# Patient Record
Sex: Female | Born: 1960 | Race: White | Hispanic: No | Marital: Married | Smoking: Former smoker
Health system: Southern US, Community
[De-identification: ages and names within clinical notes are randomized; demographics above are authoritative.]

## PROBLEM LIST (undated history)

## (undated) DIAGNOSIS — F329 Major depressive disorder, single episode, unspecified: Secondary | ICD-10-CM

## (undated) DIAGNOSIS — F32A Depression, unspecified: Secondary | ICD-10-CM

## (undated) HISTORY — DX: Major depressive disorder, single episode, unspecified: F32.9

## (undated) HISTORY — DX: Depression, unspecified: F32.A

---

## 2001-12-13 ENCOUNTER — Encounter: Payer: Self-pay | Admitting: Emergency Medicine

## 2001-12-13 ENCOUNTER — Emergency Department (HOSPITAL_COMMUNITY): Admission: EM | Admit: 2001-12-13 | Discharge: 2001-12-13 | Payer: Self-pay | Admitting: Emergency Medicine

## 2010-11-01 ENCOUNTER — Ambulatory Visit (INDEPENDENT_AMBULATORY_CARE_PROVIDER_SITE_OTHER): Payer: Managed Care, Other (non HMO) | Admitting: Physician Assistant

## 2010-11-01 DIAGNOSIS — F39 Unspecified mood [affective] disorder: Secondary | ICD-10-CM

## 2010-11-17 ENCOUNTER — Encounter (HOSPITAL_COMMUNITY): Payer: Self-pay | Admitting: Physician Assistant

## 2010-11-17 ENCOUNTER — Ambulatory Visit (INDEPENDENT_AMBULATORY_CARE_PROVIDER_SITE_OTHER): Payer: Managed Care, Other (non HMO) | Admitting: Physician Assistant

## 2010-11-17 VITALS — BP 126/85 | HR 98 | Temp 97.4°F | Ht 63.0 in | Wt 130.8 lb

## 2010-11-17 DIAGNOSIS — F329 Major depressive disorder, single episode, unspecified: Secondary | ICD-10-CM

## 2010-11-17 DIAGNOSIS — G479 Sleep disorder, unspecified: Secondary | ICD-10-CM

## 2010-11-17 DIAGNOSIS — M542 Cervicalgia: Secondary | ICD-10-CM | POA: Insufficient documentation

## 2010-11-17 DIAGNOSIS — F341 Dysthymic disorder: Secondary | ICD-10-CM

## 2010-11-17 NOTE — Patient Instructions (Signed)
Pt. Educated regarding alcohol and sleep, benzos and alcohol, and poor sleep.  Pt. Is to start with [facility] regarding out patient therapy.

## 2010-11-17 NOTE — Progress Notes (Signed)
  [facility] [id number] Progress Note  GAYNA BRADDY [id number] 50 y.o.  11/17/2010 10:08 AM  Chief Complaint:   History of Present Illness: Suicidal Ideation: No Plan Formed: No Patient has means to carry out plan: No  Homicidal Ideation: No Plan Formed: No Patient has means to carry out plan: No  Review of Systems: Psychiatric: Agitation: Yes Hallucination: No Depressed Mood: Yes Insomnia: Yes Hypersomnia: No Altered Concentration: Yes Feels Worthless: Yes Grandiose Ideas: No Belief In Special Powers: No New/Increased Substance Abuse: Yes Compulsions: Yes  Neurologic: Headache: Yes Seizure: No Paresthesias: No  Past Medical Family, Social History: Brother bipolar, son bipolar  Outpatient Encounter Prescriptions as of 11/17/2010  Medication Sig Dispense Refill  . buPROPion (WELLBUTRIN XL) 300 MG 24 hr tablet Take 300 mg by mouth daily.        . diazepam (VALIUM) 5 MG tablet       . escitalopram (LEXAPRO) 10 MG tablet Take 10 mg by mouth daily.        Marland Kitchen HYDROcodone-acetaminophen (NORCO) 10-325 MG per tablet       . Multiple Vitamins-Minerals (MULTIVITAMIN WITH MINERALS) tablet Take 1 tablet by mouth daily.          Past Psychiatric History/Hospitalization(s): Anxiety: Yes Bipolar Disorder: No Depression: Yes Mania: No Psychosis: No Schizophrenia: No Personality Disorder: No Hospitalization for psychiatric illness: No History of Electroconvulsive Shock Therapy: No Prior Suicide Attempts: No  Physical Exam: Constitutional:  BP 126/85  Pulse 98  Temp(Src) 97.4 F (36.3 C) (Temporal)  Ht 5\' 3"  (1.6 m)  Wt 130 lb 12.8 oz (59.33 kg)  BMI 23.17 kg/m2  General Appearance: alert, oriented, no acute distress and well nourished  Musculoskeletal: Strength & Muscle Tone: within normal limits Gait & Station: normal Patient leans: N/A  Psychiatric: Speech (describe rate, volume, coherence, spontaneity, and abnormalities if any): Normal rate and  rhythm  Thought Process (describe rate, content, abstract reasoning, and computation): linear  Associations: Coherent and Circumstantial  Thoughts: normal  Mental Status: Orientation: oriented to person, place, time/date and situation Mood & Affect: depressed affect, anxiety and agitation Attention Span & Concentration: normal  Medical Decision Making (Choose Three): Review of Psycho-Social Stressors (1)  Assessment: Axis I: GAD            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  Onyx Schirmer, PA 11/17/2010

## 2010-11-21 ENCOUNTER — Ambulatory Visit (HOSPITAL_COMMUNITY): Payer: Managed Care, Other (non HMO) | Admitting: Psychology

## 2010-12-11 ENCOUNTER — Ambulatory Visit (HOSPITAL_COMMUNITY): Payer: Managed Care, Other (non HMO) | Admitting: Physician Assistant

## 2010-12-26 ENCOUNTER — Ambulatory Visit (INDEPENDENT_AMBULATORY_CARE_PROVIDER_SITE_OTHER): Payer: Managed Care, Other (non HMO) | Admitting: Psychology

## 2010-12-26 ENCOUNTER — Ambulatory Visit (HOSPITAL_COMMUNITY): Payer: Managed Care, Other (non HMO) | Admitting: Psychology

## 2010-12-26 DIAGNOSIS — F331 Major depressive disorder, recurrent, moderate: Secondary | ICD-10-CM
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
## 2011-01-30 ENCOUNTER — Ambulatory Visit (HOSPITAL_COMMUNITY): Payer: Managed Care, Other (non HMO) | Admitting: Psychology

## 2011-02-06 ENCOUNTER — Ambulatory Visit (HOSPITAL_COMMUNITY): Payer: Managed Care, Other (non HMO) | Admitting: Psychology

## 2011-02-22 ENCOUNTER — Ambulatory Visit (HOSPITAL_COMMUNITY): Payer: Managed Care, Other (non HMO) | Admitting: Psychology

## 2011-02-28 ENCOUNTER — Ambulatory Visit (INDEPENDENT_AMBULATORY_CARE_PROVIDER_SITE_OTHER): Payer: Managed Care, Other (non HMO) | Admitting: Licensed Clinical Social Worker

## 2011-02-28 ENCOUNTER — Encounter (HOSPITAL_COMMUNITY): Payer: Self-pay | Admitting: Licensed Clinical Social Worker

## 2011-02-28 DIAGNOSIS — F331 Major depressive disorder, recurrent, moderate: Secondary | ICD-10-CM
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
## 2011-03-06 ENCOUNTER — Ambulatory Visit (HOSPITAL_COMMUNITY): Payer: Managed Care, Other (non HMO) | Admitting: Psychology

## 2011-03-07 ENCOUNTER — Encounter (HOSPITAL_COMMUNITY): Payer: Self-pay | Admitting: Licensed Clinical Social Worker

## 2011-03-07 ENCOUNTER — Ambulatory Visit (INDEPENDENT_AMBULATORY_CARE_PROVIDER_SITE_OTHER): Payer: Managed Care, Other (non HMO) | Admitting: Licensed Clinical Social Worker

## 2011-03-07 DIAGNOSIS — F331 Major depressive disorder, recurrent, moderate: Secondary | ICD-10-CM

## 2011-03-07 NOTE — Progress Notes (Signed)
   THERAPIST PROGRESS NOTE  Session Time: 9:12 - 10:00  Participation Level: Active  Behavioral Response: Well GroomedAlertAngry and Depressed  Type of Therapy: Individual Therapy  Treatment Goals addressed: Diagnosis: irritability and anger  Interventions: Motivational Interviewing, Solution Focused and Supportive  Summary: Katherine Sandoval is a 51 y.o. female who presents with depression.  Today Katherine Sandoval came into session with some joking and laughing.  As we started talking, she became more serious and depressed.  She is feeling "miserable" with her life.  Recently she traveled for her sons [employer] - she found herself not interested and irritable. She yelled at some people who were stepping on her - she was sitting on the floor against the wall and she was being treated like she was invisible.  She noticed that they walked around other people that were sitting on the floor but kept stepping on her.  Sounded like she had a good reason to be angry but she finds herself being too nasty in her comments. She and her husband spend no real time together and he is constantly putting her down - she cannot do things well enough for him.  The  focus in her life has been her children and trying to please her husband. Now she feels empty and unfulfilled.  Discussion today was mainly about how she feels unappreciated and she does not really know what would make her feel better.  Agreed to do some work on this problem.  She  Also may be dealing with menopause.  Still did not get to discussing her drinking for she indicated that she drowns her sorrows in wine in the eve.      ..   Suicidal/Homicidal: Nowithout intent/plan  Therapist Response: She is stating clearly that she does not want to commit suicide but she is so unhappy that she does not want to be here at times.  Plan: Return again in 1 weeks.  Diagnosis: Axis I: Major Depressive Disorder, mod, rec    Axis II:  Deferred    Takashi Korol,[name], LCSW 03/07/2011 2

## 2011-03-15 ENCOUNTER — Ambulatory Visit (INDEPENDENT_AMBULATORY_CARE_PROVIDER_SITE_OTHER): Payer: Managed Care, Other (non HMO) | Admitting: Licensed Clinical Social Worker

## 2011-03-15 DIAGNOSIS — F331 Major depressive disorder, recurrent, moderate: Secondary | ICD-10-CM
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
## 2011-03-22 ENCOUNTER — Ambulatory Visit (INDEPENDENT_AMBULATORY_CARE_PROVIDER_SITE_OTHER): Payer: Managed Care, Other (non HMO) | Admitting: Licensed Clinical Social Worker

## 2011-03-22 DIAGNOSIS — F329 Major depressive disorder, single episode, unspecified: Secondary | ICD-10-CM

## 2011-03-22 NOTE — Progress Notes (Signed)
   THERAPIST PROGRESS NOTE  Session Time: 11:10 - 1210  Participation Level: Active  Behavioral Response: CasualAlertEuthymic  Type of Therapy: Individual Therapy  Treatment Goals addressed: Anger and Anxiety  Interventions: Motivational Interviewing, Strength-based and Supportive  Summary: Katherine Sandoval is a 51 y.o. female who presents with problems of depression.  Katherine Sandoval is starting to get her resume together so that she could start applying for office jobs - she decided that she needed a job with insurance attached.   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  Her husband is helping her with the computer    She can get her needs met for decorating by working on her house which she enjoys doing.She was in pretty good spirits today as she was talking about what she was working on for herself.  Commented on that fact to her and she felt pleased.  Suicidal/Homicidal: Nowithout intent/plan  Plan: Return again in 1 weeks.  Diagnosis: Axis I: Depressive Disorder NOS    Axis II: Deferred    Cali Cuartas,[name], LCSW 03/22/2011

## 2011-03-29 ENCOUNTER — Ambulatory Visit (HOSPITAL_COMMUNITY): Payer: Self-pay | Admitting: Licensed Clinical Social Worker

## 2011-04-05 ENCOUNTER — Ambulatory Visit (INDEPENDENT_AMBULATORY_CARE_PROVIDER_SITE_OTHER): Payer: Managed Care, Other (non HMO) | Admitting: Licensed Clinical Social Worker

## 2011-04-05 DIAGNOSIS — F329 Major depressive disorder, single episode, unspecified: Secondary | ICD-10-CM

## 2011-04-10 ENCOUNTER — Ambulatory Visit (HOSPITAL_COMMUNITY): Payer: Self-pay | Admitting: Licensed Clinical Social Worker

## 2011-04-11 NOTE — Progress Notes (Signed)
   THERAPIST PROGRESS NOTE  Session Time: 11:05 - 12:00  Participation Level: Active  Behavioral Response: NeatAlertAngry and Anxious  Type of Therapy: Individual Therapy  Treatment Goals addressed: Anger and Anxiety  Interventions: Motivational Interviewing, Solution Focused and Supportive  Summary: Katherine Sandoval is a 51 y.o. female who presents with problems with depression.  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  She has accepted that it will take awhile for she has been out of work force for quite awhile.   She talked about the time she lived with her aunt and uncle when she first left home.  How they would take all of her money - they always needed money for something  Mother was home taking care of my sister.  She felt very abandoned by her mother. She also discussed her oldest son who has been clean now for three weeks. Her own feeling of abandonment is probably preventing her from pushing on her older son.  He has a girl friend that he feels in love with and he has a guy friend who is not a very good influence.    Suicidal/Homicidal: Nowithout intent/plan  Plan: Return again in 1 weeks.  Diagnosis: Axis I: Major Depression, Recurrent severe    Axis II: Deferred    Jimy Gates,[name], LCSW 04/11/2011

## 2011-04-17 ENCOUNTER — Ambulatory Visit (INDEPENDENT_AMBULATORY_CARE_PROVIDER_SITE_OTHER): Payer: Managed Care, Other (non HMO) | Admitting: Licensed Clinical Social Worker

## 2011-04-17 DIAGNOSIS — F339 Major depressive disorder, recurrent, unspecified: Secondary | ICD-10-CM

## 2011-04-17 NOTE — Progress Notes (Signed)
  1 THERAPIST PROGRESS NOTE  Session Time: 10;00 11:00  Participation Level: Active  Behavioral Response: CasualAlertDepressed  Type of Therapy: Individual Therapy  Treatment Goals addressed: Anxiety  Interventions: Motivational Interviewing, Strength-based and Supportive  Summary: Katherine Sandoval is a 51 y.o. female who presents with depression.  Today Katherine Sandoval had low energy - she sounded discouraged about about her efforts to choose things that enhance her own self image.  She reports that her husband seems to ruin everything that she tries to do.  She gave up her project in the bathroom because he is refuses to go take his shower in another bathroom   He also gets involved in her projects and re does things.  She feels powerless to do anything about it so she is giving up. Confronted her about the fact that she spent a lot of time complaining about her husband - she needed to use her energy is doing things that gave her a deep sense of satisfaction.  She decided that she needed to develop her back bone again..   Suicidal/Homicidal: Nowithout intent/plan  Therapist Response: She seems to respond to confrontation about actions she could take and not let others take her power away.  Plan: Return again in 1 weeks.  Diagnosis: Axis I: Major depression recurrent    Axis II: Deferred    Keary Hanak,[name], LCSW 04/17/2011

## 2011-04-25 ENCOUNTER — Ambulatory Visit (INDEPENDENT_AMBULATORY_CARE_PROVIDER_SITE_OTHER): Payer: Managed Care, Other (non HMO) | Admitting: Licensed Clinical Social Worker

## 2011-04-25 DIAGNOSIS — F331 Major depressive disorder, recurrent, moderate: Secondary | ICD-10-CM

## 2011-04-25 NOTE — Progress Notes (Signed)
  THERAPIST PROGRESS NOTE  Session Time: 10:00 - 11:00  Participation Level: Active  Behavioral Response: Well GroomedAlertDepressed  Type of Therapy: Individual Therapy  Treatment Goals addressed: Coping  Interventions: Motivational Interviewing, Strength-based and Supportive  Summary: BONNITA NEWBY is a 51 y.o. female who presents with depression. Today she appeared depressed - she stated that she has been depressed since last Thurs and she mainly has been staying in bed.  She could not identify a trigger at first but later in the session she remembered that she was talking to her mother and she had shared with her mother that she was thinking about going into Real [place]. Her mother's reaction was the same as usual - very discouraging and negative.  The main problem Paloma has is how she has lost herself in her role as wife and mother.  She is bored and unhappy with her life.  She spends it mainly catering to her husband and her three boys. Her oldest who is 22 has moved back home.  He seems to be satisfied to live there without much consequence - he also spends what money he makes on pot.  Her husband would not support setting limits with him and making a plan to move out.  If the boys forgot something she will bring it to them.  She is enabling her sons to be irresponsible.  She would like to be different - her husband is even worse in the enabling game.  She has not been given support by her mother in relation to being her own person.  The boys are almost all grown and she should have an empty nest so she will have less to be involved with  She admits that she is probably having trouble letting go. She will start looking at other options for herself.    Suicidal/Homicidal: Nowithout intent/plan  Plan: Return again in 1 weeks.  Diagnosis: Axis I: Major depressive disorder recurrent    Axis II: Deferred    Richelle Glick,[name], LCSW 04/25/2011

## 2011-05-02 ENCOUNTER — Ambulatory Visit (INDEPENDENT_AMBULATORY_CARE_PROVIDER_SITE_OTHER): Payer: Managed Care, Other (non HMO) | Admitting: Licensed Clinical Social Worker

## 2011-05-02 DIAGNOSIS — F339 Major depressive disorder, recurrent, unspecified: Secondary | ICD-10-CM
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
## 2011-05-16 ENCOUNTER — Ambulatory Visit (INDEPENDENT_AMBULATORY_CARE_PROVIDER_SITE_OTHER): Payer: Managed Care, Other (non HMO) | Admitting: Licensed Clinical Social Worker

## 2011-05-16 DIAGNOSIS — F331 Major depressive disorder, recurrent, moderate: Secondary | ICD-10-CM

## 2011-05-16 NOTE — Progress Notes (Signed)
   THERAPIST PROGRESS NOTE  Session Time: 10:10 - 11:05  Participation Level: Active  Behavioral Response: NeatAlertEuthymic  Type of Therapy: Individual Therapy  Treatment Goals addressed: Diagnosis: decreasing depression  Interventions: Motivational Interviewing and Supportive  Summary: Katherine Sandoval is a 51 y.o. female who presents with depression from loss of self.  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  There is one she is particularly interested in which is a new office.  She is interested in [employer] but she feels she needs health insurance if something were to happen with her marriage.  She has done this work before and likes it.  She is excited to have something for herself to look forward to.  Everyone in family are supportive of her.  She has informed all that her availability will be less.  She brought up the abortion she had when she was 11 and her mother sent her to live with her aunt - half sister of client's mother who raised her after her mother died -.who is only 10 years older than Katherine Sandoval and she always thought of her as her older sister.  That was not a very good period of her life - her aunt took advantage of her and because of her naivete she was giving her her money that she worked for and got her a phone which she tried to get her to pay for.  Her uncle was a Medical laboratory scientific officer and came on to her but she ignored him and he left her alone.  Mother sent her there because she wanted her out of state to get rid of her BF.  She actually wanted to be rid of him but she wanted to go to the city of [place] and stay with her brothers and get a job.  She was there for just under a year.  She had gotten her own apartment and it was in an unsafe neighborhood.  She reported that her sons seemed to be doing well - her two youngest are like best friends and look out for each other and she is happy to report  that her oldest shows signs of growing up - he met a girl that he really likes and she is a nice girl who is serious about her studies and is not a drug user.   All positive influences.  Client is really pleased about that.  She did not have much to say about her husband except that he did help her get her resume updated.     Suicidal/Homicidal: Nowithout intent/plan  Plan: Return again in 1 weeks.  Diagnosis: Axis I: Major depressive disorder, rec, mod    Axis II: Deferred    Katherine Sandoval,[name], LCSW 05/16/2011

## 2011-05-24 ENCOUNTER — Ambulatory Visit (INDEPENDENT_AMBULATORY_CARE_PROVIDER_SITE_OTHER): Payer: Managed Care, Other (non HMO) | Admitting: Licensed Clinical Social Worker

## 2011-05-24 DIAGNOSIS — F331 Major depressive disorder, recurrent, moderate: Secondary | ICD-10-CM

## 2011-05-25 NOTE — Progress Notes (Signed)
   THERAPIST PROGRESS NOTE  Session Time: 10:00 - 11:00  Participation Level: Active  Behavioral Response: Well GroomedAlertEuthymic  Type of Therapy: Individual Therapy  Treatment Goals addressed: Anger and Anxiety  Interventions: Motivational Interviewing, Solution Focused and Supportive  Summary: Katherine Sandoval is a 51 y.o. female who presents with depression.  [name] not sure "what she wants to be when she grows up"  She has not worked outside of home for a long time and she feels she never did find her niche.  She loves re-decorating but maybe that is just for herself.  She states that she is feeling energized about this process. It does not feel totally natural to just think about what she wants.. She believes she would like to just find something part time and school is not what interests her right now.  She is aware that she is feeling somewhat less depressed as she is thinking and using her energy differently.  Discussed how much it can free you up to realize that you can't control others and can only control yourself.  Her unhappiness with others keeps her energy away from working on self.  Suicidal/Homicidal: Nowithout intent/plan  Plan: Return again in 1 weeks.  Diagnosis: Axis I: Major depressive disorder, recurrent   Axis II: Deferred    Yosgar Demirjian,[name], LCSW 05/25/2011

## 2011-05-31 ENCOUNTER — Ambulatory Visit (HOSPITAL_COMMUNITY): Payer: Self-pay | Admitting: Licensed Clinical Social Worker

## 2011-06-01 ENCOUNTER — Ambulatory Visit (INDEPENDENT_AMBULATORY_CARE_PROVIDER_SITE_OTHER): Payer: Managed Care, Other (non HMO) | Admitting: Licensed Clinical Social Worker

## 2011-06-01 DIAGNOSIS — F329 Major depressive disorder, single episode, unspecified: Secondary | ICD-10-CM

## 2011-06-01 NOTE — Progress Notes (Signed)
   THERAPIST PROGRESS NOTE  Session Time: 11:00 -11:00  Participation Level: Active  Behavioral Response: Well GroomedAlertEuthymic  Type of Therapy: Individual Therapy  Treatment Goals addressed: Anxiety  Interventions: Motivational Interviewing, Solution Focused and Supportive  Summary: Katherine Sandoval is a 51 y.o. female who presents with depression.  [name] reported that her boys are doing well in their cheerleading and her older son has met a girl that seems to be having a good influence on him which pleases her.  She has not heard anything from her applications but that is not bothering her.  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  She is feeling that she is not happy in her marriage but feels that she needs to make the best of it.  She does not feel motivated to make that kind of a change - She does feel motivated to find things that make her happy as an individual and not to depend on her husband to make her happy.    Suicidal/Homicidal: Nowithout intent/plan  Plan: Return again in 2 weeks.  Diagnosis: Axis I: Depressive Disorder NOS    Axis II: Deferred    Tc Kapusta,[name], LCSW 06/01/2011

## 2011-06-15 ENCOUNTER — Ambulatory Visit (HOSPITAL_COMMUNITY): Payer: Self-pay | Admitting: Licensed Clinical Social Worker

## 2011-06-18 ENCOUNTER — Telehealth (HOSPITAL_COMMUNITY): Payer: Self-pay

## 2011-06-18 NOTE — Telephone Encounter (Signed)
Left message would like you to call please

## 2011-07-10 ENCOUNTER — Ambulatory Visit (INDEPENDENT_AMBULATORY_CARE_PROVIDER_SITE_OTHER): Payer: Managed Care, Other (non HMO) | Admitting: Licensed Clinical Social Worker

## 2011-07-10 DIAGNOSIS — F329 Major depressive disorder, single episode, unspecified: Secondary | ICD-10-CM

## 2011-07-10 NOTE — Progress Notes (Signed)
   THERAPIST PROGRESS NOTE  Session Time: 10:00 -10:55  Participation Level: Active  Behavioral Response: CasualAlertEuthymic  Type of Therapy: Individual Therapy  Treatment Goals addressed: Anxiety  Interventions: Motivational Interviewing and Supportive  Summary: Katherine Sandoval is a 51 y.o. female who presents with depression and anxiety.  [name] reports that her mother was here for a three weeks visit and she was proud of herself for she found a way to talk with her mother about her childhood.  Her mother usually becomes defensive and she did not this time.  So she must have presented her concerns differently for her mother heard her and understood better why Katherine Sandoval feels the way that she does. Basically Katherine Sandoval did not feel mothered.  Katherine Sandoval shared that her mother is quite self centered and likes the lime light - she wonders if she was jealous of Katherine Sandoval because she got lots of attention when she was born because she was the first granddaughter on both sides of the family. Mother is not too happy that she is coming to counseling. She also discussed her relationship with her sister which is a good one.  They appreciate each other.She talks a lot of how she and her husband do not spend much qualtiy time with each other and do not have fun together anymore.  She has fun with her sons which is going to be a problem when they leave and she admits she likes having them around.  Discussed how having an unhealthy relationship with her husband is not a good reason to like having her sons around for they will leave and get their own lives if they are healthy. She states what she wants out of counseling is to work on her fear of getting out in the world and getting a job. 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  She knows it will develop her self confidence and help her self esteem for she does not get great feedback at home.   Suicidal/Homicidal: No  Plan: Return  again in 2 weeks.  Diagnosis: Axis I: Depressive Disorder NOS    Axis II: Deferred    Keionte Swicegood,[name], LCSW 07/10/2011

## 2011-07-25 ENCOUNTER — Ambulatory Visit (INDEPENDENT_AMBULATORY_CARE_PROVIDER_SITE_OTHER): Payer: Managed Care, Other (non HMO) | Admitting: Licensed Clinical Social Worker

## 2011-07-25 DIAGNOSIS — F331 Major depressive disorder, recurrent, moderate: Secondary | ICD-10-CM

## 2011-07-25 NOTE — Progress Notes (Signed)
   THERAPIST PROGRESS NOTE  Session Time: 9:00 - 10:00  Participation Level: Active  Behavioral Response: CasualAlertEuthymic  Type of Therapy: Individual Therapy  Treatment Goals addressed: Anger and Anxiety  Interventions: Motivational Interviewing and Supportive  Summary: Katherine Sandoval is a 51 y.o. female who presents with depression.  Mateya mainly talked about her oldest son.  He has a plan to move out. She set limits on sexual behavior in her house to him and GF. She figured it was ok because he has a pit bull that slept between them in a small bed. This pit bull is not friendly toward anyone threatening her son.  She goes to school during the day and works at night.  Her sons are re-bonding and her oldest seems to be getting his act together. She also has been setting better limits with her son.  Being more proactive in getting her needs met.  She is not just sitting and waiting and feeling disappointed and hurt when she does not get her needs met.  She is feeling better and counselor expressed positive feedback to her.  Suicidal/Homicidal: Nowithout intent/plan  Plan: Return again in 3 weeks.  Diagnosis: Axis I: Major depressive disorder, recurrent    Axis II: Deferred    Nazair Fortenberry,[name], LCSW 07/25/2011

## 2011-08-15 ENCOUNTER — Ambulatory Visit (INDEPENDENT_AMBULATORY_CARE_PROVIDER_SITE_OTHER): Payer: Managed Care, Other (non HMO) | Admitting: Licensed Clinical Social Worker

## 2011-08-15 DIAGNOSIS — F331 Major depressive disorder, recurrent, moderate: Secondary | ICD-10-CM

## 2011-08-15 NOTE — Progress Notes (Signed)
   THERAPIST PROGRESS NOTE  Session Time: 10:15 - 11:05  Participation Level: Active  Behavioral Response: CasualAlertEuthymic  Type of Therapy: Individual Therapy  Treatment Goals addressed: Anxiety  Interventions: Motivational Interviewing and Supportive  Summary: MERIEL KELLIHER is a 51 y.o. female who presents with depression.  Kearstyn reports that she is feeling much better.  Part is the medication is working but a great deal of it has been the process she has been going through looking at what she needs and wants in her life. She is being more assertive with her boys and with her husband .  Her oldest son is moving out with his GF so she feels a big accomplishment have been made. Her other two boys are still involved in cheerleading and they have had the team over and she has enjoyed that.  She feels she is involved in a more positive way.  She is aware that she feels better about herself when she feels useful.  The two in cheerleading have healthy habits because of needing to stay physically fit.  She and her husband seem t be doing better.  She is also realizing the positive parts of her life.  She thinks coming in once per month for awhile would be helpful to make sure she is staying on track. Suicidal/Homicidal: Nowithout intent/plan  Plan: Return again in 2 weeks.  Diagnosis: Axis I: Depressive Disorder NOS    Axis II: Deferred    Karmin Kasprzak,[name], LCSW 08/15/2011

## 2011-09-18 ENCOUNTER — Ambulatory Visit (HOSPITAL_COMMUNITY): Payer: Self-pay | Admitting: Licensed Clinical Social Worker

## 2011-09-26 ENCOUNTER — Ambulatory Visit (INDEPENDENT_AMBULATORY_CARE_PROVIDER_SITE_OTHER): Payer: Managed Care, Other (non HMO) | Admitting: Licensed Clinical Social Worker

## 2011-09-26 DIAGNOSIS — F331 Major depressive disorder, recurrent, moderate: Secondary | ICD-10-CM

## 2011-09-26 NOTE — Progress Notes (Signed)
   THERAPIST PROGRESS NOTE  Session Time: 11:30 - 12:30  Participation Level: Active  Behavioral Response: CasualAlertAngry  Type of Therapy: Individual Therapy  Treatment Goals addressed: Anger, Anxiety and Coping  Interventions: Motivational Interviewing and Supportive  Summary: Katherine Sandoval is a 51 y.o. female who presents with depression.  [name] reports that she is angry with her whole family.  Her oldest moved out with his GF which is fine but she has had to subsidize him a few times.  He is supposed to pay her back.  His GF is lazy and so she has her reservations about her.  He is driving a [place] which is Cathy's truck and it is bad on mileage so gas is a big problem.  Later on he can use his father's truck which is better mileage.  Her middle son [name] signed up for [employer] and did not pay so his courses were not registered so she went back to pay for them.  He barely passed HS - did not find out that he was going to graduate until the day before and then he wondered why she did not give a big party with a cake.  He did poorly on SAT's because he had a bad attitude that day and did not care.  So he cannot apply to colleges until he pulls that up.  She believes he is getting depressed again and needs to come back to counseling with Tasia Catchings.  Her younger son [name] comes across in a condescending arrogant way and feels he is entitled  to insert himself in any conversation.   Her husband is doing well financially if his company gets sold he will get a large sum of money where they would be able to get out debt and pay th e house off.  He is unique in his work and he had a job offer for a lot of money but he has to wait until the company sells or he gives uo his pay out.  He will be getting a new contract which includes a percentage of ownership with new company.  He got frustrated with [name] and said he was going to leave and she told him he could leave and he would owe her a lot  of money. She feels that she is the woman behind the man for she helped him see that she had value.. She has not been exercising because she does not have an interest in it which is unusual for her.  She is losing interest in things.  Discussed how she is over involved with everyone else's like and not her own. She needs to work on herself again.  As long as her energies are committed to others she will lose herself again and she will get depressed.  She agrees and want to return for more therapy.   Suicidal/Homicidal: Nowithout intent/plan  Plan: Return again in 1 weeks.  Diagnosis: Axis I: Major Depression, Recurrent severe    Axis II: Deferred    Meggan Dhaliwal,[name], LCSW 09/26/2011

## 2011-10-04 ENCOUNTER — Ambulatory Visit (HOSPITAL_COMMUNITY): Payer: Self-pay | Admitting: Licensed Clinical Social Worker

## 2011-10-05 ENCOUNTER — Ambulatory Visit (HOSPITAL_COMMUNITY): Payer: Self-pay | Admitting: Licensed Clinical Social Worker

## 2011-10-10 ENCOUNTER — Ambulatory Visit (HOSPITAL_COMMUNITY): Payer: Self-pay | Admitting: Licensed Clinical Social Worker

## 2011-10-25 ENCOUNTER — Ambulatory Visit (INDEPENDENT_AMBULATORY_CARE_PROVIDER_SITE_OTHER): Payer: Managed Care, Other (non HMO) | Admitting: Licensed Clinical Social Worker

## 2011-10-25 DIAGNOSIS — F331 Major depressive disorder, recurrent, moderate: Secondary | ICD-10-CM

## 2011-10-25 NOTE — Progress Notes (Signed)
   THERAPIST PROGRESS NOTE  Session Time: 8:00 - 9:00  Participation Level: Active  Behavioral Response: CasualAlertEuthymic  Type of Therapy: Individual Therapy  Treatment Goals addressed: Anger and Coping  Interventions: Motivational Interviewing and Supportive  Summary: Katherine Sandoval is a 51 y.o. female who presents with frustration with family. [name] reports that she is tired of how the men in her house behave.  Her oldest son moved out with his GF - he had a dog and then he got a puppy and now his GF got another dog.  They are not trained and they were leaving them at [facility].  She put a stop to that.  She feels his GF is lazy she is going to school and was working as a Child psychotherapist 3 days a week but she quit which does not help them financially.  Her middle son is not functioning well expecting to be taken care of in certain ways but he does ot contribute to the home or himself.  If she backs off and stops taking care of them - her husband steps in and takes care of them which continues to enable them. The youngest is doing the best - doing well in school and motivated to do things.  She was complaining a lot about her sons and husband until I pointed out that it sounded like she was bored and needed to have something to focus on for herself.  She was essentially done with taking care of children and needed to decide what she wanted to do when she grew up.  We have been discussing this before.  She does not disagree that this is the problem but she gets stuck there.  She is going to write down ideas of things she enjoys doing and see what comes out of that.  Suicidal/Homicidal: Nowithout intent/plan  Plan: Return again in 1 weeks.  Diagnosis: Axis I: Anxiety Disorder NOS    Axis II: Deferred    Dayna Geurts,[name], LCSW 10/25/2011

## 2011-11-06 ENCOUNTER — Ambulatory Visit (HOSPITAL_COMMUNITY): Payer: Self-pay | Admitting: Licensed Clinical Social Worker

## 2011-11-09 ENCOUNTER — Ambulatory Visit (INDEPENDENT_AMBULATORY_CARE_PROVIDER_SITE_OTHER): Payer: Managed Care, Other (non HMO) | Admitting: Licensed Clinical Social Worker

## 2011-11-09 DIAGNOSIS — F331 Major depressive disorder, recurrent, moderate: Secondary | ICD-10-CM

## 2011-11-09 NOTE — Progress Notes (Signed)
   THERAPIST PROGRESS NOTE  Session Time: 11:10 - 12:00  Participation Level: Active  Behavioral Response: CasualAlertEuthymic  Type of Therapy: Individual Therapy  Treatment Goals addressed: Anger  Interventions: Motivational Interviewing  Summary: Katherine Sandoval is a 51 y.o. female who presents with depression.  Quantia reported on the situations with her sons being disrespectful to her.  She is especially upset when they are rude in front of people.  She is not making it clear that their behavior is unacceptable.  She needs to give some consequences instead of just talking or yelling at them for they are not listening. She needs to be less accomodation to their needs.  She agreed that she was probably too available. 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 They are used to mom being available for anything they need.  Her older son did tell them that they were not being appropriated and that their mom was always there for them and they should treat her accordingly.  It felt good to her that one of her son could see her value.  Discussed how that usually comes when they are in their twenties.  Her other sons would see it too but she did not need to put up with rude behavior until they get it.  Jeyli seems to be getting her needs met by her sons and their friends.  This is a common problem with stay at home moms who are losing their job and do not have a close relationship with their husbands.    One very good thing that has happened is that her husband's company has sold so they will be able to pay off their debts, pay off their house and build onto the house and still have good savings  This is taking a lot of pressure off of both of them.  She and her husband are doing better since this pressure is off financially. They are communicating better and planning a trip together without the children.  He even expressed appreciation to her for all she has done to support him so  he could concentrated on his work and this final process.She would like to continue to come in to work on her relationship with the boys.  Suicidal/Homicidal: Nowithout intent/plan  Plan: Return again in 2 weeks.  Diagnosis: Axis I: Depressive Disorder NOS    Axis II: Deferred    Anjenette Gerbino,[name], LCSW 11/09/2011

## 2011-11-22 ENCOUNTER — Ambulatory Visit (INDEPENDENT_AMBULATORY_CARE_PROVIDER_SITE_OTHER): Payer: Managed Care, Other (non HMO) | Admitting: Licensed Clinical Social Worker

## 2011-11-22 DIAGNOSIS — F331 Major depressive disorder, recurrent, moderate: Secondary | ICD-10-CM

## 2011-11-23 NOTE — Progress Notes (Signed)
   THERAPIST PROGRESS NOTE  Session Time: 10:05 - 10:55  Participation Level: Active  Behavioral Response: CasualAlertEuthymic  Type of Therapy: Individual Therapy  Treatment Goals addressed: Anger  Interventions: Motivational Interviewing and Supportive  Summary: Katherine Sandoval is a 51 y.o. female who presents with depression.   Jeraldin reports that her rash is gone finally - she talked about how it was being on prednisone - gained weight and she was so edgy.  So the boys being provocative did not help.  The two boys at home are demanding - wanting everything and anything and she has to keep reminding them that there is no money for some things that they want.  They do not have a realistic view of the situation.  She has even shown them the bills and where the money goes.  She worries that they are planning for the future because her husband did toll eth boys how much money they are getting from the sale. She wished d he a=had not done that because they will think this is permission to get what they want.  There are plans for the money and the are going to a financial advisor to set things up just so they don't blow their windfall.  Her husband has worked hard and it is well deserved.  He will continue to work even though the company is sold for his wok is indispensable and there is no else to what he does.  Today  is their anniversary but Katherine Sandoval are not going to have a big celebration now for they will be taking a big trip after they get their money.  They are going to put money in a trust funds for each of the boys and it will get set up so that they get monthly income.  She has had to pay the rent for the oldest and half of that belongs to the GF.  She is tired of bailing him out.  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  They are already helping him with car insurance, cell phone and car. She has to set more limits on this.   She is  not happy with his GF for she sees her as a user.  She is getting tired of putting out and not getting anything back - meaning more that they function better.  The boys at home do nothing and she would like to see her oldest get his act together and find a career that could support him....   Suicidal/Homicidal: Nowithout intent/plan  Plan: Return again in 2 weeks.  Diagnosis: Axis I: Major depressive disorder, recurrent    Axis II: Deferred    Casson Catena,[name], LCSW 11/23/2011

## 2011-12-12 ENCOUNTER — Ambulatory Visit (INDEPENDENT_AMBULATORY_CARE_PROVIDER_SITE_OTHER): Payer: Managed Care, Other (non HMO) | Admitting: Licensed Clinical Social Worker

## 2011-12-12 DIAGNOSIS — F331 Major depressive disorder, recurrent, moderate: Secondary | ICD-10-CM

## 2011-12-12 NOTE — Progress Notes (Signed)
   THERAPIST PROGRESS NOTE  Session Time: 10:10 - 11:00  Participation Level: Active  Behavioral Response: CasualAlertEuthymic  Type of Therapy: Individual Therapy  Treatment Goals addressed: Anger  Interventions: Motivational Interviewing and Supportive  Summary: Katherine Sandoval is a 51 y.o. female who presents with a pleasant mood.  Katherine Sandoval not happy about how BD was celebrated.  They were all supposed to go out to eat but her oldest was late and under the influence so they ordered [place] and she does not even like pizza.  Her husband was not so festive but her two younger boys were sweet and remembered and made a big deal about it.  It was a special BD since she turned 51.  Her husband has promised her s special trip after their money come sin.   Her oldest has no money for his bill s and she has decided to draw the line and not pay their bills anymore.  She is not really contributing.  She feels his GF is using him and is not helping.  He will probably be back in the house again.  Her oldest really does need treatment.  She realizes that she needs to draw a line in the sand with all of her boys for she has been too indulgent and takes care of too many things for them.  She would to be so angry if she did that..   Suicidal/Homicidal: Nowithout intent/plan  Therapist Response: She goes back and forth about focusing on her children and husband rather than herself.  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  Her definition of self needs to change.  Plan: Return again in 2 weeks.  Diagnosis: Axis I: Major depressive disorder, recurrent    Axis II: Deferred    [name],[name], LCSW 12/12/2011

## 2012-01-15 ENCOUNTER — Ambulatory Visit (INDEPENDENT_AMBULATORY_CARE_PROVIDER_SITE_OTHER): Payer: Managed Care, Other (non HMO) | Admitting: Licensed Clinical Social Worker

## 2012-01-15 DIAGNOSIS — F331 Major depressive disorder, recurrent, moderate: Secondary | ICD-10-CM

## 2012-01-15 NOTE — Progress Notes (Signed)
  THERAPIST PROGRESS NOTE  Session Time: 10:05 - 11:00  Participation Level: Active  Behavioral Response: NeatAlertEuthymic  Type of Therapy: Individual Therapy  Treatment Goals addressed: Anger  Interventions: Motivational Interviewing and Supportive  Summary: Katherine Sandoval is a 52 y.o. female who presents with frustrations.  [name] in the process of looking at how to focus on self better.  Discussed a lot about mother, sister and son.  How she has not ben appreciated by her family about how hard she has worked over the years to do the right thing. She and her mother have never really gotten along - She has looked for her mother to be proud of her only to get criticism.  She has done a lot to help her sister and her son only to not be appreciated.  She has begun to realized that she needs to give those things to herself.  If she does not do things based on what she feels is right or worthwhile then she has to stop.  She needs to be clear about her motivation for doing what she does for others. Is she really being helpful or is she enabling bad behavior.  What is she doing to take care of herself?  She would feel less deprived if she were giving to herself.    Suicidal/Homicidal: Nowithout intent/plan  Plan: Return again in 2 weeks.  Diagnosis: Axis I: Major Depressive Disorder recurrent, moderate    Axis II: Deferred    Allyson Tineo,[name], LCSW 01/15/2012

## 2012-01-29 ENCOUNTER — Ambulatory Visit (INDEPENDENT_AMBULATORY_CARE_PROVIDER_SITE_OTHER): Payer: Managed Care, Other (non HMO) | Admitting: Licensed Clinical Social Worker

## 2012-01-29 DIAGNOSIS — F331 Major depressive disorder, recurrent, moderate: Secondary | ICD-10-CM

## 2012-01-29 NOTE — Progress Notes (Signed)
   THERAPIST PROGRESS NOTE  Session Time: 10:30 - 11:00  Participation Level: Active  Behavioral Response: CasualAlertEuthymic  Type of Therapy: Individual Therapy  Treatment Goals addressed: Anger  Interventions: Motivational Interviewing and Supportive  Summary: Katherine Sandoval is a 52 y.o. female who presents with problems with her sons.  Her oldest is self medicating with pot and perhaps other drugs.  He takes seroquel as ordered.  She feels he behavies and thinks a lot like her brother who committed suicide. He was diagnosed Bipolar amd did not medicate approprately.She wants his PMD to refer him to a psychiatrist.  Her younger sons continue to be disrespectful in the way they talk to her. . 'they came in second in a competition and lost by one tenth of a point which was disappointing but obviously they had done well. This will the last year for her older son to be in cheerleading.  He is teaching younger kids gymnastics right now  The good news is that her husband is going with her and they are having a good time together and he is trying to lose weight . She is pleased with this result.  It seems they are both more relaxed since husband's company has been sold and he will get his share from that which will help them do some things that they have been wanting to do.  He will still have a job for he will be working for [employer]. They have lowered tensions about money  Suicidal/Homicidal: No  Therapist Response: Trying to get her to define more clearly her goals in treatment now that the situation at home has changed.  Plan: Return again in 2 weeks.  Diagnosis: Axis I: Major depressive disorder, mod rec    Axis II: Deferred    Jamarii Banks,[name], LCSW 01/29/2012

## 2012-02-14 ENCOUNTER — Ambulatory Visit (INDEPENDENT_AMBULATORY_CARE_PROVIDER_SITE_OTHER): Payer: Managed Care, Other (non HMO) | Admitting: Licensed Clinical Social Worker

## 2012-02-14 DIAGNOSIS — F331 Major depressive disorder, recurrent, moderate: Secondary | ICD-10-CM

## 2012-02-14 NOTE — Progress Notes (Signed)
   THERAPIST PROGRESS NOTE  Session Time: 10:00 - 11:00  Participation Level: Active  Behavioral Response: Casual and NeatAlertEuthymic  Type of Therapy: Individual Therapy  Treatment Goals addressed: Anxiety  Interventions: Motivational Interviewing and Supportive  Summary: ABBIGAYLE TOOLE is a 52 y.o. female who presents with problems with family issues    [place] reports that she is struggling with a headache because of a fall she had missing a step when she was running and hit her hear hard against the door at the top of the stairs. Advised to be attentive to her injury she may need to be checked medically.  Her husband will be getting his share of sale of business today and they will be in good shape financially and she is excited about that.  They have decided to either build or buy a new house instead of remodeling their house which would put their house too high priced for the neighborhood.  She talked bout her sons today.  Her youngest is doing well = getting good grades and dong what he is supposed to.  Middle son is slacking a bit on the grades and this is the last year that he will be cheerleading for he i aged out.  His older son he is worried about him having problems with his bipolar illness.  She feels is self medicating with drugs.  She is going to leave a message for his doctor.  She knows he cannot talk to her but she can let him know what she is observing.  She was in a good mood today and she feels that she and her husband are doing better since their financial stress is relieved.  She is even taking care of himself and trying to lose weight.      Suicidal/Homicidal: No  Therapist Response:  Need to review her treatment plan  Plan: Return again in 2 weeks.  Diagnosis: Axis I: Major Depressive Disorder, rec mod    Axis II: Deferred    Jomari Bartnik,[name], LCSW 02/14/2012

## 2012-03-11 ENCOUNTER — Ambulatory Visit (INDEPENDENT_AMBULATORY_CARE_PROVIDER_SITE_OTHER): Payer: Managed Care, Other (non HMO) | Admitting: Licensed Clinical Social Worker

## 2012-03-11 NOTE — Progress Notes (Signed)
   THERAPIST PROGRESS NOTE  Session Time: 10:20 - 12:00  Participation Level: Active  Behavioral Response: NeatAlertEuthymic  Type of Therapy: Individual Therapy  Treatment Goals addressed: Anger  Interventions: Motivational Interviewing and Supportive  Summary: ABBAGAIL SCAFF is a 52 y.o. female who presents with being late.  She reported that it was hard to get up this morning.  They got back from [place] last night.  Her two boys had a cheerleading meet.  Their team came in third - mother feels they should have come in second.  Her youngest son was sick with a flu and he did his meet anyway.  It was hectic for there were 50,000 people in the stadium and people were rude and hard to deal with.  Bettyjean found herself being easy going for a good while and then she would lose it and get people out of her way.  They had to wait 4 and 1/2 hours to get in their romm and the food was bad in the hotel.  Of course, it was busy as can be.  Her husband came with her and he is coming from now on because the boys behave better when he is there.  Also the boys do not have to be as concerned about their mother because her husband is with her.  They are enjoying the idea of being well off.   Her husband bought her a very expensive purse - he went to the [place] with her and invited her into a very posh store.  She could not believe how much they spent and she was happy with her husbands gift.  She wants to continue sessions because she knows she is going to have a lot to deal with - with her oldest son who is not taking his legal medications for bipolar illness and he is taking illegal drugs.  .   Suicidal/Homicidal: No  Therapist Response:   Husband is taking better care of himself - working out and eating better which she had been trying to get him to do.  So that is making a big difference in how she feels as well as being financially solvent.  Plan: Return again in 4 weeks.  Diagnosis: Axis I: Major  depressive dissorder, rec, mod    Axis II: Deferred    BELL,[name], LCSW 03/11/2012

## 2012-03-25 ENCOUNTER — Ambulatory Visit (HOSPITAL_COMMUNITY): Payer: Self-pay | Admitting: Licensed Clinical Social Worker

## 2012-04-08 ENCOUNTER — Ambulatory Visit (INDEPENDENT_AMBULATORY_CARE_PROVIDER_SITE_OTHER): Payer: Managed Care, Other (non HMO) | Admitting: Licensed Clinical Social Worker

## 2012-04-08 DIAGNOSIS — F331 Major depressive disorder, recurrent, moderate: Secondary | ICD-10-CM
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
## 2012-05-07 ENCOUNTER — Ambulatory Visit (INDEPENDENT_AMBULATORY_CARE_PROVIDER_SITE_OTHER): Payer: Managed Care, Other (non HMO) | Admitting: Licensed Clinical Social Worker

## 2012-05-07 DIAGNOSIS — F331 Major depressive disorder, recurrent, moderate: Secondary | ICD-10-CM

## 2012-05-07 NOTE — Progress Notes (Signed)
   THERAPIST PROGRESS NOTE  Session Time: 10:07 - 11:00  Participation Level: Active  Behavioral Response: Well GroomedAlertEuthymic  Type of T herapy: Individual  Treatment Goals addressed: Anger  Interventions: Motivational Interviewing  Summary: Katherine Sandoval is a 52 y.o. female who presents with a report on how well her two younger sons are doing in competition of cheer leading.  Her middle son just has his last competition because he is 43 and going to college.  The youngest will continue to compete.  She and her husband are communicating better.  She is going to get a tummy tuck and a face lift.  They can now afford it.  He husbnad does not want her to get a tummmy tuck - fear of something happening to her.  She has done well under anesthesia before.  She is looking forward to the procedures to be done.  Her husband is working hard in his new job with the new company.  She is concerned about her oldest son - wants to get him to a psychiatrist.   He needs appropriate medication and some help with detoxing.    She is having trouble losing her hair and she feels she is under less stress - yet she is getting bald spots.  .   Suicidal/Homicidal: No  Therapist Response:  She is more relaxed than she has been since starting therapy.  Plan: Return again in 4 weeks.  Diagnosis: Axis I: Major Depression, Recurrent severe    Axis II: Deferred    Danny Zimny,[name], LCSW 05/07/2012

## 2012-05-21 ENCOUNTER — Ambulatory Visit (HOSPITAL_COMMUNITY): Payer: Self-pay | Admitting: Licensed Clinical Social Worker

## 2012-06-18 ENCOUNTER — Ambulatory Visit (HOSPITAL_COMMUNITY): Payer: Self-pay | Admitting: Licensed Clinical Social Worker

## 2012-07-15 ENCOUNTER — Ambulatory Visit (HOSPITAL_COMMUNITY): Payer: Self-pay | Admitting: Licensed Clinical Social Worker

## 2012-07-22 ENCOUNTER — Ambulatory Visit (INDEPENDENT_AMBULATORY_CARE_PROVIDER_SITE_OTHER): Payer: Managed Care, Other (non HMO) | Admitting: Licensed Clinical Social Worker

## 2012-07-22 DIAGNOSIS — F331 Major depressive disorder, recurrent, moderate: Secondary | ICD-10-CM
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
## 2012-08-05 ENCOUNTER — Ambulatory Visit (INDEPENDENT_AMBULATORY_CARE_PROVIDER_SITE_OTHER): Payer: Managed Care, Other (non HMO) | Admitting: Licensed Clinical Social Worker

## 2012-08-05 DIAGNOSIS — F331 Major depressive disorder, recurrent, moderate: Secondary | ICD-10-CM

## 2012-08-05 NOTE — Progress Notes (Signed)
   THERAPIST PROGRESS NOTE  Session Time: 10:00 - 11:00  Participation Level: Active  Behavioral Response: NeatAlertEuthymic  Type of Therapy: Individual Therapy  Treatment Goals addressed: Anxiety  Interventions: Motivational Interviewing and Supportive  Summary: Katherine Sandoval is a 52 y.o. female who presents with a good mood.  She is happily re-modeling her bathrooms and kitchen - also other decorating - rugs and painting.  They could not build anything off their house so they decided to re- do some areas. She is working with a Contractor and having fun picking out materials.  They will also be doing landscaping and they have re-lined their pool  They settled on the house for their son [name] who is in the process of moving out of her house to the new house  He is pleased with the change - he has taken his dogs and snakes..  He had his GF come over but he made it clear that this was his house and she needed to do as he asked.  He kicked her out when she pushed his dog off the bed and put her dog on.  [name] pleased that he is setting boundaries and limits with her.  Her husband is more anxious about the changes and she is excited.  They are spending a lot of money But they put a lot away in investments also.  So taking care of responsibilities first.   Concerned about her sons using pot.  Suicidal/Homicidal: No  Therapist Response: Doing better but is still concerned about older son - to see Dr. Hilton Cork on 8/15  Plan: Return again in 2 weeks.  Diagnosis: Axis I: Major Depression, Recurrent severe    Axis II: Deferred    [name],[name], LCSW 08/05/2012

## 2012-08-21 ENCOUNTER — Ambulatory Visit (INDEPENDENT_AMBULATORY_CARE_PROVIDER_SITE_OTHER): Payer: Managed Care, Other (non HMO) | Admitting: Licensed Clinical Social Worker

## 2012-08-21 DIAGNOSIS — F331 Major depressive disorder, recurrent, moderate: Secondary | ICD-10-CM
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
## 2012-09-04 ENCOUNTER — Ambulatory Visit (INDEPENDENT_AMBULATORY_CARE_PROVIDER_SITE_OTHER): Payer: Managed Care, Other (non HMO) | Admitting: Licensed Clinical Social Worker

## 2012-09-04 DIAGNOSIS — F331 Major depressive disorder, recurrent, moderate: Secondary | ICD-10-CM
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
## 2012-09-18 ENCOUNTER — Ambulatory Visit (INDEPENDENT_AMBULATORY_CARE_PROVIDER_SITE_OTHER): Payer: Managed Care, Other (non HMO) | Admitting: Licensed Clinical Social Worker

## 2012-09-18 DIAGNOSIS — F331 Major depressive disorder, recurrent, moderate: Secondary | ICD-10-CM
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
## 2012-10-02 ENCOUNTER — Ambulatory Visit (INDEPENDENT_AMBULATORY_CARE_PROVIDER_SITE_OTHER): Payer: Managed Care, Other (non HMO) | Admitting: Licensed Clinical Social Worker

## 2012-10-02 DIAGNOSIS — F331 Major depressive disorder, recurrent, moderate: Secondary | ICD-10-CM

## 2012-10-02 NOTE — Progress Notes (Signed)
   THERAPIST PROGRESS NOTE  Session Time: 10:00 - 10:55  Participation Level: Active  Behavioral Response: Well GroomedAlertEuthymic  Type of Therapy: Individual Therapy  Treatment Goals addressed: Coping  Interventions: Motivational Interviewing and Supportive  Summary: Katherine Sandoval is a 52 y.o. female who presents with depression.  Katherine Sandoval reports that her remodeling is going well  Showed some pictures of some changes.  She is a bit tired of cleaning up the dust etc  She is having some trouble with her youngest being disrespectful in front of his friends - showing off so she handed it back to him in front of his friends.  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 She just warns him not to get arrogant which he has done in the past If he got some college education , he could work for a zoo which he would love to do.  What he is aiming for right now is travel between stores and take care of all reptiles.  Her husband loves his work but he is stressed right now.  She talks of having good and bad days.  Brought up to her that she is close to empty nest, her husband is involved in his work and she is taking care of house remolding and all. But she does not have any other outlets.  Other interests or GF's - she stated that her husband never liked her going out with girlfriends.. She admits being in a house full of female energy gets old. She needs to look at other interests and ways to meet other women.  Suicidal/Homicidal: No  Plan: Return again in 3 weeks.  Diagnosis: Axis I: Major Depressive Disorder, recurrent mod    Axis II: Deferred    Inayah Woodin,[name], LCSW 10/02/2012

## 2012-10-16 ENCOUNTER — Ambulatory Visit (INDEPENDENT_AMBULATORY_CARE_PROVIDER_SITE_OTHER): Payer: Managed Care, Other (non HMO) | Admitting: Licensed Clinical Social Worker

## 2012-10-16 ENCOUNTER — Ambulatory Visit (HOSPITAL_COMMUNITY): Payer: Self-pay | Admitting: Licensed Clinical Social Worker

## 2012-10-16 DIAGNOSIS — F331 Major depressive disorder, recurrent, moderate: Secondary | ICD-10-CM

## 2012-10-30 ENCOUNTER — Ambulatory Visit (HOSPITAL_COMMUNITY): Payer: Self-pay | Admitting: Behavioral Health

## 2012-11-04 ENCOUNTER — Ambulatory Visit (INDEPENDENT_AMBULATORY_CARE_PROVIDER_SITE_OTHER): Payer: Managed Care, Other (non HMO) | Admitting: Licensed Clinical Social Worker

## 2012-11-04 DIAGNOSIS — F331 Major depressive disorder, recurrent, moderate: Secondary | ICD-10-CM

## 2012-11-04 NOTE — Progress Notes (Signed)
   THERAPIST PROGRESS NOTE  Session Time: 1:10 - 2:00  Participation Level: Active  Behavioral Response: Well GroomedAlertEuthymic  Type of Therapy: Individual Therapy  Treatment Goals addressed: Anger and Anxiety  Interventions: Motivational Interviewing and Supportive  Summary: Katherine Sandoval is a 52 y.o. female who presents with frustrations with sons and attitudes.  Oldest has girl living with him again - Bernestine is going to charge her rent and make up a contract with her.  This is not [name]'s house to let anyone live there for free.  She knows she needs to start expecting more from [place] also - he is working part-time and loves it still but he should be using some of that money on paying for something for himself - his phone - utilities - groceries.  They tend to spend a lot of money eating out which needs to change.  She needs to start showing him that he is capable and can do better for himself.  Caryn Bee is complaining a lot - and not being helpful and gets no consequences about that except an argument with his mom.  Discussed this issue and ways to get out of the struggle with him.  She is stressed with all the re-modeling and no appreciation for what she has to do.  It is almost complete and life will become more normal again.  Arlys John is less of a problem and he will do what she asks most of the time.  Her husband likes to tinker and has caused some issue with work being done.  She feels surrounded by female energy and it was a pleasure to get out with her sister and her mom the other day.  She feels good about her ability to negotiate with contractors and her ability to follow up and not allow mistakes to stand.   Discussed the fact that I am retiring and we will probably only be able to meet a couple of times before I leave.  She understands and it is not totally unexpected.  She stated that she feels that I have really helped her.  Her depression has lifted and she is feeling more secure  within herself.  She is also more intouch with her competence..   Suicidal/Homicidal: No  Plan: Return again in 2 weeks.  Diagnosis: Axis I: Major depressive disorder, recurent, mod    Axis II: Deferred    Summerlyn Fickel,[name], LCSW 11/04/2012

## 2012-11-18 ENCOUNTER — Ambulatory Visit (HOSPITAL_COMMUNITY): Payer: Self-pay | Admitting: Licensed Clinical Social Worker

## 2012-11-20 ENCOUNTER — Ambulatory Visit (HOSPITAL_COMMUNITY): Payer: Self-pay | Admitting: Licensed Clinical Social Worker

## 2012-11-27 ENCOUNTER — Ambulatory Visit (INDEPENDENT_AMBULATORY_CARE_PROVIDER_SITE_OTHER): Payer: Managed Care, Other (non HMO) | Admitting: Licensed Clinical Social Worker

## 2012-11-27 DIAGNOSIS — F331 Major depressive disorder, recurrent, moderate: Secondary | ICD-10-CM

## 2012-11-27 NOTE — Progress Notes (Signed)
   THERAPIST PROGRESS NOTE  Session Time: 10:00 - 11:00  Participation Level: Active  Behavioral Response: Well GroomedAlertEuthymic  Type of Therapy: Individual Therapy  Treatment Goals addressed: Anxiety  Interventions: Motivational Interviewing and Supportive  Summary: Katherine Sandoval is a 52 y.o. female who presents with good spirits.  [name] sharing that things are going well with her boys right now.  Her youngest is planning to graduate early and get to college - her middle is working and going to school.  He was depressed when he had to quit gymnastics and her younger son got him into Cheerleading which helped him a lot - they are best friends now. Her oldest still working and is seeing the GF much less and making her leave after a couple of days - not letting her move in  With him.  Youngest is hanging around oldest more.  Her husband stressed with his job - and sometimes takes those feeling out on her but she lets him know that is not acceptable.   [name] is able to stand up for herself more now.  She finds it difficult sometimes to be in an all female house - she does need to establish some female friend. She does find it hard to do that.  She is spending full time on all the remodeling they are doing in their house -she was proudly showing me pictures of what has been done..  She admits that she is so much happier now that they have money to do the things that she wants to do and no debt.  She admits to being vain and she has had the plastic surgery and she has no regrets. She expressed the feeling that our work together has really helped her and she does not feel urgency about seeing someone else but she would like a name of someone for the times that she need to talk with someone.  She finds it helpful to have an objective person helping her look at her situation with her.  Suicidal/Homicidal: No  Therapist Response: She really has no one to talk with about personal things -  there are limitations in the marriage around that and she does not have good friends around her.  Plan: Return again in about 6 weeks. Can wait to meet new therapist  Diagnosis: Axis I: Major depressive disorder, recurrent, mild    Axis II: Deferred    Larron Armor,[name], LCSW 11/27/2012

## 2012-12-02 NOTE — Progress Notes (Signed)
   THERAPIST PROGRESS NOTE  Session Time: 11:00 - 12:00  Participation Level: Active  Behavioral Response: CasualAlertEuthymic  Type of Therapy: Individual Therapy  Treatment Goals addressed: Anxiety  Interventions: Motivational Interviewing and Supportive  Summary: Katherine Sandoval is a 52 y.o. female who presents with thoughtful consideration about dealing with her sons.  In some ways they are doing well but she would like them to take on more responsibility - personal and otherwise.  She is looking at how to deal with that with all three.  She would like to relax some in relation to them.  She does not like the way they talk with her at times and she wished her husband would speak up about that some more than he does.  She is making it clear when their communication is unacceptable but she is not sure they take her seriously unless she gets really angry and she does not like that   She realizes that she has spoiled them and it will probably take awhile to re-train them to deal with her respectfully.  Discussed how children no matter how old do not like it if mom is not talking to them - it is a powerful position to take - silence.  .   Suicidal/Homicidal: No  Therapist Response: [name] is developing more of a strong sense of self.  Plan: Return again in 2 weeks.  Diagnosis: Axis I: Major Depressive Disorder, mod , rec    Axis II: Deferred    Ralf Konopka,[name], LCSW 12/02/2012

## 2013-02-10 ENCOUNTER — Ambulatory Visit (HOSPITAL_COMMUNITY): Payer: Self-pay | Admitting: Psychiatry

## 2013-02-12 ENCOUNTER — Ambulatory Visit (INDEPENDENT_AMBULATORY_CARE_PROVIDER_SITE_OTHER): Payer: BC Managed Care – PPO | Admitting: Psychiatry

## 2013-02-12 DIAGNOSIS — F331 Major depressive disorder, recurrent, moderate: Secondary | ICD-10-CM

## 2013-02-12 DIAGNOSIS — F330 Major depressive disorder, recurrent, mild: Secondary | ICD-10-CM
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
## 2013-02-19 ENCOUNTER — Encounter (INDEPENDENT_AMBULATORY_CARE_PROVIDER_SITE_OTHER): Payer: Self-pay

## 2013-02-19 ENCOUNTER — Ambulatory Visit (INDEPENDENT_AMBULATORY_CARE_PROVIDER_SITE_OTHER): Payer: BC Managed Care – PPO | Admitting: Psychiatry

## 2013-02-19 DIAGNOSIS — F331 Major depressive disorder, recurrent, moderate: Secondary | ICD-10-CM
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
## 2013-02-19 NOTE — Progress Notes (Deleted)
[name] is a 53 y.o. female in treatment for *** and displays the following risk factors for Suicide:  Demographic factors:  {CHL AMB BH Suicide Demographics:21022747:a} Current Mental Status: {CHL AMB BH Suicide Current Mental Status:21022748:a} Loss Factors: {CHL AMB BH Suicide Loss Factors:21022749:a} Historical Factors: {CHL AMB BH Suicide Historical Factors:21022750:a} Risk Reduction Factors: {CHL AMB BH Suicide Risk Reduction Factors:21022751:a}  CLINICAL FACTORS:  {Clinical Factors:22706}  COGNITIVE FEATURES THAT CONTRIBUTE TO RISK: {Cognitive Features:22703}    SUICIDE RISK:  {BHH SUICIDE RISK:22704}  Mental Status: *** General Appearance /Behavior:  {BHH GENERAL APPEARANCE/BEHAVIOR:22300} Eye Contact:  {BHH EYE CONTACT:22301} Motor Behavior:  {BHH MOTOR BEHAVIOR:22302} Speech:  {BHH SPEECH:22304} Level of Consciousness:  {BHH LEVEL OF CONSCIOUSNESS:22305} Mood:  {BHH MOOD:22306} Affect:  {BHH AFFECT:22307} Anxiety Level:  {BHH ANXIETY LEVEL:22308} Thought Process:  {BHH THOUGHT PROCESS:22309} Thought Content:  {BHH THOUGHT CONTENT:22310} Perception:  {BHH PERCEPTION:22311} Judgment:  {BHH JUDGMENT:22312} Insight:  {BHH INSIGHT:22313} Cognition:  {BHH COGNITION:22314} Sleep: ***  PLAN OF CARE: ***   Harshita Bernales E, LCSW 02/19/2013, 11:01 AM

## 2013-02-26 ENCOUNTER — Ambulatory Visit (INDEPENDENT_AMBULATORY_CARE_PROVIDER_SITE_OTHER): Payer: BC Managed Care – PPO | Admitting: Psychiatry

## 2013-02-26 DIAGNOSIS — F331 Major depressive disorder, recurrent, moderate: Secondary | ICD-10-CM
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
## 2013-03-05 ENCOUNTER — Ambulatory Visit (HOSPITAL_COMMUNITY): Payer: BC Managed Care – PPO | Admitting: Psychiatry

## 2013-03-09 ENCOUNTER — Ambulatory Visit (INDEPENDENT_AMBULATORY_CARE_PROVIDER_SITE_OTHER): Payer: BC Managed Care – PPO | Admitting: Psychiatry

## 2013-03-09 DIAGNOSIS — F331 Major depressive disorder, recurrent, moderate: Secondary | ICD-10-CM

## 2013-03-09 NOTE — Progress Notes (Signed)
   THERAPIST PROGRESS NOTE  Session Time: 1:00-1:50 pm  Participation Level: Active  Behavioral Response: Neat and Well GroomedAlertDepressed  Type of Therapy: Individual Therapy  Treatment Goals addressed: Depression and Family  Interventions: CBT, Strength-based and Supportive  Summary: [name] is a 53 y.o. female who presents with moderate depressed mood and affect. Pt described themes of feeling taken for granted and disrespected by her husband and three sons. Pt said her husband only points out what is wrong with everything she does and the same is true for her three children. Pt said she was unable to come up with any positive qualities about herself and listed the negative traits she sees. Pt said she is "selfish, unkind, stupid aggressive and a bad speaker". Pt said those are the messages she received from her mother and continues to receive from her family. During the session Pt took a phone call from her youngest son who was stuck at the airport and could not find his flight [place] from [place]. Pt walked him through fixing the situation, which took much of the session. 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 Pt said she also has been blocked from all contact from her sons cheerleading parents due to her behavior at a recent event. Pt said her family sees her as a "emotional" and said she "overreacts". Pt does not see that as accurate, but does identify with feeling overwhelmed with how she is being treated by her family and by others.   Suicidal/Homicidal: No  Therapist Response: Assessed overall level of depression symptoms per Pt self report. Explored Pts lack of healthy boundaries with her family and how she allows them to disrespect her without setting limits to their mistreatment. Examined how Pts protective tendencies towards her family and herself may come across negatively to others and push people  away from her. Pt lacks insight into her tendency to come across as aggressive in situations and sees the problem as with the others and nothing to do with how they are reacting to Pts behaviors. Pointed out strengths Pt has and challenged some negative perceptions Pt has of herself.   Plan: Return again this week due to Pt not having the full therapy session. Explore boundaries in the next session to help Pt learn how to set healthier limits with her family.   Diagnosis: Axis I: Depression Major Recurrent Moderate       Maalik Pinn E, LCSW 03/09/2013

## 2013-03-10 ENCOUNTER — Ambulatory Visit (INDEPENDENT_AMBULATORY_CARE_PROVIDER_SITE_OTHER): Payer: BC Managed Care – PPO | Admitting: Psychiatry

## 2013-03-10 DIAGNOSIS — F331 Major depressive disorder, recurrent, moderate: Secondary | ICD-10-CM
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
## 2013-03-16 ENCOUNTER — Ambulatory Visit (INDEPENDENT_AMBULATORY_CARE_PROVIDER_SITE_OTHER): Payer: BC Managed Care – PPO | Admitting: Psychiatry

## 2013-03-16 DIAGNOSIS — F331 Major depressive disorder, recurrent, moderate: Secondary | ICD-10-CM

## 2013-03-16 NOTE — Progress Notes (Signed)
   THERAPIST PROGRESS NOTE  Session Time: 10:00-10:50 am  Participation Level: Active  Behavioral Response: Neat and Well GroomedAlertEuthymic  Type of Therapy: Individual Therapy  Treatment Goals addressed: Depression, Family and Relationships  Interventions: CBT, Strength-based and Supportive  Summary: Katherine SkinnerCatherine M Sandoval is a 53 y.o. female who presents with euthymic mood and affect. Pt reports "having a good week" with minimal stressors. Pt said her household is routinely stressful and there is always something going on, but she enjoyed her week. Pt said she has a busy week coming up with driving her sons and some of their friends to a competition in [place] this week. Pt said she is looking forward to going down and will also be seeing her mother and an old friend she had when she lived in [place]. Pt said next week her sons have a competition in [place] that her and her husband will be traveling to see. Pt said she offered to take some of the other parents children on their flight to make sure the kids got their safely and paid a little extra money for one child to get him on the same flight. Pt said her mother would have accused Pt of doing these acts of kindness to get credit and broadcast her kindness. Pt struggles with seeing herself as "selfish" when doing simple acts of kindness for others due to all the negative messages she received as a child and still receives from her mother.   Suicidal/Homicidal: No  Therapist Response: Assessed overall level of functioning Per Pt self report. Challenged negative perceptions of self by refaming negative  messages received from her mother, used strength based therapy to identify strengths and build self-confidence,  and explored ways Pt can feel more confident in self despite the judgments of others, Explored ways Pt is being an affective mother and husband   Plan: Return again in 1 weeks. Continue educating on assertive vs agressive  communication, building self-confidence and challenge negative beliefs about self instilled by mother.   Diagnosis: Axis I: Depression Major Recurrent Mild       Ark Agrusa E, LCSW 03/16/2013

## 2013-03-24 ENCOUNTER — Ambulatory Visit (INDEPENDENT_AMBULATORY_CARE_PROVIDER_SITE_OTHER): Payer: BC Managed Care – PPO | Admitting: Psychiatry

## 2013-03-24 ENCOUNTER — Encounter (INDEPENDENT_AMBULATORY_CARE_PROVIDER_SITE_OTHER): Payer: Self-pay

## 2013-03-24 DIAGNOSIS — F331 Major depressive disorder, recurrent, moderate: Secondary | ICD-10-CM

## 2013-03-24 NOTE — Progress Notes (Signed)
   THERAPIST PROGRESS NOTE  Session Time: 11:00-11:50 am  Participation Level: Active  Behavioral Response: Meticulous, Neat and Well GroomedAlertEuthymic  Type of Therapy: Individual Therapy  Treatment Goals addressed: Depression and Family  Interventions: CBT and Supportive  Summary: Katherine SkinnerCatherine M Sandoval is a 53 y.o. female who presents with euthymic mood and affect. Pt reports having fun in [place] this past week to watch her son complete in the cheer competition in [place]. Pt showed pictures of the event and said she got home late last night. Pt talked about preparing for their upcoming trip this week to [place] for another cheer competition. Pt said she feels good and processed stable mood with minimal stressors. Pt talked about getting alone ok with her mom on her trip to [place] and processed how she tends to care take for others and has for all her life because she did not receive that as a child from her mother. Pt has awareness that she over gives to others, but does not have plans to change it. Pt is hoping to starting doing some more things for herself in addition to supporting others.    Suicidal/Homicidal: No  Therapist Response: Assessed overall level of depression per Pt self report. reviewed stressors from past week and planned for upcming stressors that could trigger depression symptoms, reviewed coping skills and processed past situations with Pts mother that still influence her today.   Plan: Return again in 1 week.  Diagnosis: Axis I: Depression major Recurrent Moderate      Lajada Janes E, LCSW 03/24/2013

## 2013-04-01 ENCOUNTER — Ambulatory Visit (HOSPITAL_COMMUNITY): Payer: Self-pay | Admitting: Psychiatry

## 2013-04-07 ENCOUNTER — Encounter (INDEPENDENT_AMBULATORY_CARE_PROVIDER_SITE_OTHER): Payer: Self-pay

## 2013-04-07 ENCOUNTER — Ambulatory Visit (INDEPENDENT_AMBULATORY_CARE_PROVIDER_SITE_OTHER): Payer: BC Managed Care – PPO | Admitting: Psychiatry

## 2013-04-07 DIAGNOSIS — F331 Major depressive disorder, recurrent, moderate: Secondary | ICD-10-CM

## 2013-04-07 NOTE — Progress Notes (Signed)
   THERAPIST PROGRESS NOTE  Session Time: 10:15-10:55 am   Participation Level: Active  Behavioral Response: Neat and Well GroomedAlertEuthymic  Type of Therapy: Individual Therapy  Treatment Goals addressed: Relationships  Interventions: CBT, Solution Focused and Strength-based  Summary: Katherine SkinnerCatherine M Sandoval is a 53 y.o. female who presents with euthymic mood and affect and described having a good time in [place] watching her sons cheer team compete. Pt said her mood has been good, but she is angry about a situation that occurred with her son and some of the other other cheer members parents at the competition. Pt said they accused her son of smelling like "marijuana" and started a rumor that her son brought "weed" to the event. Pt became very upset and said she will fight this to the top because she wants this addressed with the parents. Pt expressed anger about her son being "labeled". Pt said her son did bring marijuana to the last competition in [place] and also brought alcohol to a friends house, but she does not like it when the other parents talk "bad" about her son. Pt said other than that she had a great time, but struggles to see why these other parents don't like her and her son.   Suicidal/Homicidal: No  Therapist Response: Assessed overall level of functioning per Pt self report, processed feelings associated with conflict she had a her sons competition and explored her feelings of defensiveness and anger pertaining to her sons behavior, explored Pts thought process and tendency to get angry and get involved in situations that have a negative impact on her relationships with others.   Plan: Return again in 1 weeks.  Diagnosis: Axis I: Depression Major Recurrent Moderate      Negin Hegg E, LCSW 04/07/2013

## 2013-04-14 ENCOUNTER — Encounter (INDEPENDENT_AMBULATORY_CARE_PROVIDER_SITE_OTHER): Payer: Self-pay

## 2013-04-14 ENCOUNTER — Ambulatory Visit (INDEPENDENT_AMBULATORY_CARE_PROVIDER_SITE_OTHER): Payer: BC Managed Care – PPO | Admitting: Psychiatry

## 2013-04-14 DIAGNOSIS — F331 Major depressive disorder, recurrent, moderate: Secondary | ICD-10-CM

## 2013-04-14 NOTE — Progress Notes (Signed)
   THERAPIST PROGRESS NOTE  Session Time: 11:00-11:50 am  Participation Level: Active  Behavioral Response: Neat and Well GroomedAlertEuthymic  Type of Therapy: Individual Therapy  Treatment Goals addressed: Depression and Relationships  Interventions: CBT and Supportive  Summary: Katherine SkinnerCatherine M Sandoval is a 53 y.o. female who presents with euthymic mood and affect. Pt reports doing well and her primary focus of today was to discuss her relationship with her three sons and how she feels responsible for not parenting them better. Pt gave examples of how all three of them swear at her and disrespect her. Pt described them all as getting "out of control at times".  Pt said her two youngest sons were teased when they were younger for have feminine qualities and doing gymnastics and cheerleading. Pt struggled at first to see that was why they could have been teased and at first said it was because they were "blond haired and blue eyed". Pt believes this too is why she has difficulty in relationships with others and struggles to see that her aggressive tendencies could be causing conflict in her own personal relationships with others. Pt reports doing well overall and said she feels "good".   Suicidal/Homicidal: No   Therapist Response: Assessed overall level of functioning per Pt self report, provided supportive therapy surrounding her relationships with her sons and explored how her and her sons behaviors could be contributing to their social difficulties.   Plan: Return again in 1 weeks.  Diagnosis: Axis I: Depression Major Recurrent Mild       Delbra Zellars E, LCSW 04/14/2013

## 2013-04-21 ENCOUNTER — Encounter (INDEPENDENT_AMBULATORY_CARE_PROVIDER_SITE_OTHER): Payer: Self-pay

## 2013-04-21 ENCOUNTER — Ambulatory Visit (INDEPENDENT_AMBULATORY_CARE_PROVIDER_SITE_OTHER): Payer: BC Managed Care – PPO | Admitting: Psychiatry

## 2013-04-21 DIAGNOSIS — F331 Major depressive disorder, recurrent, moderate: Secondary | ICD-10-CM
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
## 2013-04-28 ENCOUNTER — Ambulatory Visit (INDEPENDENT_AMBULATORY_CARE_PROVIDER_SITE_OTHER): Payer: BC Managed Care – PPO | Admitting: Psychiatry

## 2013-04-28 DIAGNOSIS — F331 Major depressive disorder, recurrent, moderate: Secondary | ICD-10-CM

## 2013-04-28 NOTE — Progress Notes (Signed)
   THERAPIST PROGRESS NOTE  Session Time: 11:00-11:50 am  Participation Level: Active  Behavioral Response: Neat and Well GroomedAlertEuthymic  Type of Therapy: Individual Therapy  Treatment Goals addressed: Depression and Family  Interventions: CBT, Solution Focused and Supportive  Summary: Katherine SkinnerCatherine M Sandoval is a 53 y.o. female who presents with euthymic mood and affect. Pt reports doing good since her last session. Her stressors continue to be feeling unappreciated by her three children, husband and extended family. Pt gave examples from this past week where her sons disrespected her and engaged in socially inapropriate behaviors in public. Pt said she feel responsible for spoiling her children and their disrespectful and entitled behaviors being the result of that. Pt had awareness that the boys also have Bipolar disorder, but feels she could have parented them in a way that made them more appreciated and hard working. Pt described an incident that also occurred with her mother where her mother acted in an entitled manner as well, wanting Pt to by her a $1600 [employer]ie [employer] tan purse and guilted Pt when Pt refused. Pt processed feelings of disappointment and hurt towards her family.   Suicidal/Homicidal: No  Therapist Response: Assessed overall level of functioning per Pt self report, explored feelings associated with stress within the family and options for managing the stress more effectively, educated on healthy boundaries with her children and mother, modeled healthy social and communication skills and encouraged use of coping skills for depression and stress management.   Plan: Return again in 1 weeks.  Diagnosis: Axis I: Depression Major Recurrent Mild      Keilani Terrance E, LCSW 04/28/2013

## 2013-05-05 ENCOUNTER — Ambulatory Visit (HOSPITAL_COMMUNITY): Payer: Self-pay | Admitting: Psychiatry

## 2013-05-12 ENCOUNTER — Ambulatory Visit (INDEPENDENT_AMBULATORY_CARE_PROVIDER_SITE_OTHER): Payer: BC Managed Care – PPO | Admitting: Psychiatry

## 2013-05-12 DIAGNOSIS — F331 Major depressive disorder, recurrent, moderate: Secondary | ICD-10-CM

## 2013-05-12 NOTE — Progress Notes (Signed)
   THERAPIST PROGRESS NOTE  Session Time: 11:10-11:55 pm  Participation Level: Active  Behavioral Response: Neat and Well GroomedAlertelevated  Type of Therapy: Individual Therapy  Treatment Goals addressed: Depression and Family  Interventions: Strength-based and Supportive  Summary: Mickeal SkinnerCatherine M Portlock is a 53 y.o. female who presents with elevated mood and affect. Pt was smiling and said things are going well for her and with her family. Pt described connecting more with her husband this past week in [place] while at their sons cheerleading competition and her son winning the competition. Pt said her life feels in balance due to every thing with her family being stable. Pt denied any active depression or anxiety symptoms and processed feelings of wellness.    Suicidal/Homicidal: No   Therapist Response: Assessed overall level of functioning per Pt self report, reviewed triggers to wellness and provided supportive counseling.   Plan: Return again in 1 weeks.  Diagnosis: Axis I: Depression Major Recurrent Mild       Zeddie Njie E, LCSW 05/12/2013

## 2013-05-19 ENCOUNTER — Ambulatory Visit (HOSPITAL_COMMUNITY): Payer: Self-pay | Admitting: Psychiatry

## 2013-05-26 ENCOUNTER — Ambulatory Visit (INDEPENDENT_AMBULATORY_CARE_PROVIDER_SITE_OTHER): Payer: BC Managed Care – PPO | Admitting: Psychiatry

## 2013-05-26 ENCOUNTER — Encounter (INDEPENDENT_AMBULATORY_CARE_PROVIDER_SITE_OTHER): Payer: Self-pay

## 2013-05-26 DIAGNOSIS — F331 Major depressive disorder, recurrent, moderate: Secondary | ICD-10-CM

## 2013-05-26 NOTE — Progress Notes (Signed)
   THERAPIST PROGRESS NOTE  Session Time: 10:00-10:50 am   Participation Level: Active  Behavioral Response: Neat and Well GroomedAlertDepressed  Type of Therapy: Individual Therapy  Treatment Goals addressed: Depression and Family  Interventions: CBT, Solution Focused and Supportive  Summary: Katherine SkinnerCatherine M Sandoval is a 53 y.o. female who presents with mild depressed mood pertaining to stress regarding her oldest son. Pt processed concerns she has about her oldest son [name] age 52) and his anger outbursts, lack of personal responsibility, complete financial dependence and oppositional defiant behaviors. Pt was honest with her struggles with her son and feeling helpless to get him stabilized. Pt said she pays all his bills and rent and fears if she stops her will kill himself due to his severe mood stability. Pt explored options and expressed feelings of frustration.    Suicidal/Homicidal: No  Therapist Response: Assessed overall level of depression per Pt self report, processed feelings pertaining to stress with son and explored options for Pt to get her son support for his mental illness and for Pt to maintain her own mood stability given her worry over her son.   Plan: Return again after writers maternity leave.  Diagnosis: Axis I: Depression Major Recurrent Mild      [name] E, LCSW 05/26/2013

## 2013-06-03 ENCOUNTER — Ambulatory Visit (HOSPITAL_COMMUNITY): Payer: Self-pay | Admitting: Psychiatry

## 2013-07-07 ENCOUNTER — Ambulatory Visit (INDEPENDENT_AMBULATORY_CARE_PROVIDER_SITE_OTHER): Payer: BC Managed Care – PPO | Admitting: Psychiatry

## 2013-07-07 ENCOUNTER — Encounter (INDEPENDENT_AMBULATORY_CARE_PROVIDER_SITE_OTHER): Payer: Self-pay

## 2013-07-07 VITALS — BP 120/88 | HR 88 | Ht 63.0 in | Wt 118.0 lb

## 2013-07-07 DIAGNOSIS — F330 Major depressive disorder, recurrent, mild: Secondary | ICD-10-CM

## 2013-07-07 DIAGNOSIS — F411 Generalized anxiety disorder: Secondary | ICD-10-CM

## 2013-07-07 DIAGNOSIS — F331 Major depressive disorder, recurrent, moderate: Secondary | ICD-10-CM

## 2013-07-07 MED ORDER — BUPROPION HCL ER (XL) 450 MG PO TB24: 450.0000 mg | ORAL_TABLET | Freq: Every day | ORAL | Status: DC
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
## 2013-08-11 ENCOUNTER — Encounter (INDEPENDENT_AMBULATORY_CARE_PROVIDER_SITE_OTHER): Payer: Self-pay

## 2013-08-11 ENCOUNTER — Ambulatory Visit (INDEPENDENT_AMBULATORY_CARE_PROVIDER_SITE_OTHER): Payer: BC Managed Care – PPO | Admitting: Psychiatry

## 2013-08-11 DIAGNOSIS — F331 Major depressive disorder, recurrent, moderate: Secondary | ICD-10-CM

## 2013-08-11 MED ORDER — BUPROPION HCL ER (XL) 450 MG PO TB24: 450.0000 mg | ORAL_TABLET | Freq: Every day | ORAL | Status: DC

## 2013-08-11 MED ORDER — BUPROPION HCL ER (XL) 450 MG PO TB24: 450.0000 mg | ORAL_TABLET | Freq: Every day | ORAL | Status: AC
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
## 2013-10-12 ENCOUNTER — Ambulatory Visit (HOSPITAL_COMMUNITY): Payer: Self-pay | Admitting: Psychiatry

## 2013-10-13 ENCOUNTER — Encounter (INDEPENDENT_AMBULATORY_CARE_PROVIDER_SITE_OTHER): Payer: Self-pay

## 2013-10-13 ENCOUNTER — Ambulatory Visit (INDEPENDENT_AMBULATORY_CARE_PROVIDER_SITE_OTHER): Payer: BC Managed Care – PPO | Admitting: Psychiatry

## 2013-10-13 ENCOUNTER — Encounter (HOSPITAL_COMMUNITY): Payer: Self-pay | Admitting: Psychiatry

## 2013-10-13 VITALS — BP 108/87 | HR 105 | Ht 62.5 in | Wt 116.0 lb

## 2013-10-13 DIAGNOSIS — F419 Anxiety disorder, unspecified: Secondary | ICD-10-CM

## 2013-10-13 DIAGNOSIS — F331 Major depressive disorder, recurrent, moderate: Secondary | ICD-10-CM

## 2013-10-13 MED ORDER — BUPROPION HCL ER (XL) 450 MG PO TB24: 450.0000 mg | ORAL_TABLET | Freq: Every day | ORAL | Status: DC

## 2013-10-13 NOTE — Progress Notes (Signed)
Patient ID: Katherine Sandoval, female   DOB: September 23, 1960, 53 y.o.   MRN: [id number] [facility] Follow-up Outpatient Visit  LINDE WILENSKY [id number] 53 y.o.  10/13/2013  Chief Complaint: depression follo wup    History of Present Illness:   Patient returns for Medication Follow up and is diagnosed with symptoms of depression, withdrawn, decrease interest in activities Husband has a VP job and they have been married for 28 years.  As per Initial intake note "Pt stated her main stressors are "being the only female in an all-female household.Pt does not have any hobbies or activities she does outside of caring for her family and there appears to be a enmeshed relationship. Pt and her husband are financial privileged and Pt denies any current financial stressors. Worried and not happy about her son's girlfriend and also her mom is coming visit. Family dynamics plays big role on her well being.  Patient has responded to Wellbutrin. Last visit we increased the dose of Wellbutrin and stopped Lexapro. She's feeling to have some more energy less tiredness and less hitting of thought depression. She still gets entangled in family issues of taking care of her son and her husband she believes that an understanding if I want to take some time to myself.  Modifying factors: trying to keep her busy  Duration of depression this time more then 2 years.  Initial depression was when first son was born.  Context: stress at home, relationship and finances in the past.  Severity 6/10. 10 being v happy. Improved.     Past Medical History  Diagnosis Date  . Depression    Family History  Problem Relation Age of Onset  . Bipolar disorder Brother   . Bipolar disorder Son     Outpatient Encounter Prescriptions as of 10/13/2013  Medication Sig  . BuPROPion HCl ER, XL, 450 MG TB24 Take 450 mg by mouth daily.  . diazepam (VALIUM) 5 MG tablet   . estrogens conjugated, synthetic A,  (CENESTIN) 0.3 MG tablet Take 0.3 mg by mouth daily.  Marland Kitchen HYDROcodone-acetaminophen (NORCO) 10-325 MG per tablet   . levothyroxine (SYNTHROID, LEVOTHROID) 50 MCG tablet Take 50 mcg by mouth daily.  . Multiple Vitamins-Minerals (MULTIVITAMIN WITH MINERALS) tablet Take 1 tablet by mouth daily.    . BuPROPion HCl ER, XL, 450 MG TB24 Take 450 mg by mouth daily.  . [DISCONTINUED] BuPROPion HCl ER, XL, 450 MG TB24 Take 450 mg by mouth daily.    No results found for this or any previous visit (from the past 2160 hour(s)).  BP 108/87  Pulse 105  Ht 5' 2.5" (1.588 m)  Wt 116 lb (52.617 kg)  BMI 20.87 kg/m2   Review of Systems  Constitutional: Negative.   Neurological: Negative for dizziness and tremors.  Psychiatric/Behavioral: Positive for depression. Negative for suicidal ideas, hallucinations and substance abuse. The patient is not nervous/anxious.     Mental Status Examination  Appearance: casual Alert: Yes Attention: fair  Cooperative: Yes Eye Contact: Good Speech: normal Psychomotor Activity: Normal Memory/Concentration: adequate  Oriented: person, place and time/date Mood: Euthymic Affect: Congruent Thought Processes and Associations: Coherent Fund of Knowledge: Fair Thought Content: Suicidal ideation and Homicidal ideation were denied. Insight: Fair Judgement: Fair  Diagnosis: Maj. depressive disorder recurrent moderate. Improving.  Anxiety disorder NOS  Treatment Plan:   Continue Wellbutrin 450mg . Would benefit from therapy. Will start seeing Carollee Herter this week. That will help. May consider to add small dose of lexapro if needed but  will wait till next visit see how therapy goes.  Pertinent Labs and Relevant Prior Notes reviewed. Medication Side effects, benefits and risks reviewed/discussed with Patient. Time given for patient to respond and asks questions regarding the Diagnosis and Medications. Safety concerns and to report to ER if suicidal or call 911. Relevant  Medications refilled or called in to pharmacy. Discussed weight maintenance and Sleep Hygiene. Follow up with Primary care provider in regards to Medical conditions. Recommend compliance with medications and follow up office appointments. Discussed to avail opportunity to consider or/and continue Individual therapy with Counselor. Greater than 50% of time was spend in counseling and coordination of care with the patient.  Schedule for Follow up visit in 2  months or call in earlier as necessary.  Thresa RossAKHTAR, Charish Schroepfer, MD 10/13/2013

## 2013-12-07 ENCOUNTER — Other Ambulatory Visit: Payer: Self-pay | Admitting: Obstetrics and Gynecology

## 2013-12-07 DIAGNOSIS — N63 Unspecified lump in unspecified breast: Secondary | ICD-10-CM

## 2013-12-14 ENCOUNTER — Ambulatory Visit (HOSPITAL_COMMUNITY): Payer: Self-pay | Admitting: Psychiatry

## 2013-12-15 ENCOUNTER — Ambulatory Visit (INDEPENDENT_AMBULATORY_CARE_PROVIDER_SITE_OTHER): Payer: BC Managed Care – PPO | Admitting: Psychiatry

## 2013-12-15 ENCOUNTER — Encounter (INDEPENDENT_AMBULATORY_CARE_PROVIDER_SITE_OTHER): Payer: Self-pay

## 2013-12-15 ENCOUNTER — Encounter (HOSPITAL_COMMUNITY): Payer: Self-pay | Admitting: Psychiatry

## 2013-12-15 VITALS — BP 127/78 | HR 89 | Ht 63.0 in | Wt 116.0 lb

## 2013-12-15 DIAGNOSIS — F331 Major depressive disorder, recurrent, moderate: Secondary | ICD-10-CM

## 2013-12-15 DIAGNOSIS — F419 Anxiety disorder, unspecified: Secondary | ICD-10-CM

## 2013-12-15 DIAGNOSIS — F411 Generalized anxiety disorder: Secondary | ICD-10-CM

## 2013-12-15 MED ORDER — BUPROPION HCL ER (XL) 450 MG PO TB24: 450.0000 mg | ORAL_TABLET | Freq: Every day | ORAL | Status: AC

## 2013-12-15 NOTE — Progress Notes (Signed)
Patient ID: Katherine SkinnerCatherine M Sandoval, female   DOB: 1960/11/19, 53 y.o.   MR[facility] [id number] [facility]  Katherine SkinnerCatherine M Sandoval [id number] 53 y.o.  12/15/2013  Chief Complaint: depression followup    History of Present Illness:   Patient returns for Medication Follow up and is diagnosed with symptoms of depression, withdrawn, decrease interest in activities Husband has a VP job and they have been married for 28 years.  As per Initial intake note "Pt stated her main stressors are "being the only female in an all-female household.Pt does not have any hobbies or activities she does outside of caring for her family and there appears to be a enmeshed relationship. Pt and her husband are financial privileged and Pt denies any current financial stressors. Today's worries include the holiday stress. One of her son's girlfriend has left him to other girlfriend of the other son is now living with them she is too so they are practicing Hanukkah with her. Overall remains stressed because of having to do so much for the boys and her husband. Tolerating medication at times she feels down but not hopeless Family dynamics plays big role on her well being.  She feels Wellbutrin has been better than Lexapro.. She's feeling to have some more energy less tiredness and less hitting of thought depression. She still gets entangled in family issues of taking care of her son and her husband she believes that an understanding if I want to take some time to myself.  Modifying factors: trying to keep her busy  Duration of depression this time more then 2 years.  Initial depression was when first son was born.  Context: stress at home, relationship and finances in the past.  Severity 6/10. 10 being v happy. Improved.     Past Medical History  Diagnosis Date  . Depression    Family History  Problem Relation Age of Onset  . Bipolar disorder Brother   . Bipolar disorder Son      Outpatient Encounter Prescriptions as of 12/15/2013  Medication Sig  . BuPROPion HCl ER, XL, 450 MG TB24 Take 450 mg by mouth daily.  Melene Muller. [START ON 01/08/2014] BuPROPion HCl ER, XL, 450 MG TB24 Take 450 mg by mouth daily.  . diazepam (VALIUM) 5 MG tablet   . estrogens conjugated, synthetic A, (CENESTIN) 0.3 MG tablet Take 0.3 mg by mouth daily.  Marland Kitchen. HYDROcodone-acetaminophen (NORCO) 10-325 MG per tablet   . levothyroxine (SYNTHROID, LEVOTHROID) 50 MCG tablet Take 50 mcg by mouth daily.  . Multiple Vitamins-Minerals (MULTIVITAMIN WITH MINERALS) tablet Take 1 tablet by mouth daily.    . [DISCONTINUED] BuPROPion HCl ER, XL, 450 MG TB24 Take 450 mg by mouth daily.    No results found for this or any previous visit (from the past 2160 hour(s)).  BP 127/78 mmHg  Pulse 89  Ht 5\' 3"  (1.6 m)  Wt 116 lb (52.617 kg)  BMI 20.55 kg/m2   Review of Systems  Constitutional: Negative for fever.  Cardiovascular: Negative for chest pain.  Skin: Negative for rash.  Psychiatric/Behavioral: Positive for depression. Negative for suicidal ideas, hallucinations and substance abuse. The patient is not nervous/anxious.     Mental Status Examination  Appearance: casual Alert: Yes Attention: fair  Cooperative: Yes Eye Contact: Good Speech: normal Psychomotor Activity: Normal Memory/Concentration: adequate  Oriented: person, place and time/date Mood: Euthymic Affect: Congruent Thought Processes and Associations: Coherent Fund of Knowledge: Fair Thought Content: Suicidal ideation and Homicidal ideation were denied. Insight:  Fair Judgement: Fair  Diagnosis: Maj. depressive disorder recurrent moderate. Improving.  Anxiety disorder NOS  Treatment Plan:   Continue Wellbutrin 450mg .  She will continue therapy with Carollee HerterShannon. She will decide whether she will continue to follow. Or [facility] since she has other family members coming to this clinic. meds refilled.  Pertinent Labs and  Relevant Prior Notes reviewed. Medication Side effects, benefits and risks reviewed/discussed with Patient. Time given for patient to respond and asks questions regarding the Diagnosis and Medications. Safety concerns and to report to ER if suicidal or call 911. Relevant Medications refilled or called in to pharmacy. Discussed weight maintenance and Sleep Hygiene. Follow up with Primary care provider in regards to Medical conditions. Recommend compliance with medications and follow up office appointments. Discussed to avail opportunity to consider or/and continue Individual therapy with Counselor. Greater than 50% of time was spend in counseling and coordination of care with the patient.  Schedule for Follow up visit in 2  months or call in earlier as necessary.  Thresa RossAKHTAR, Murdis Flitton, MD 12/15/2013

## 2013-12-17 ENCOUNTER — Other Ambulatory Visit: Payer: Self-pay | Admitting: Obstetrics and Gynecology

## 2013-12-17 ENCOUNTER — Ambulatory Visit
Admission: RE | Admit: 2013-12-17 | Discharge: 2013-12-17 | Disposition: A | Discharge status: Home / Self Care | Payer: BC Managed Care – PPO | Source: Ambulatory Visit | Attending: Obstetrics and Gynecology | Admitting: Obstetrics and Gynecology

## 2013-12-17 DIAGNOSIS — N63 Unspecified lump in unspecified breast: Secondary | ICD-10-CM

## 2014-01-14 ENCOUNTER — Ambulatory Visit
Admission: RE | Admit: 2014-01-14 | Discharge: 2014-01-14 | Disposition: A | Discharge status: Home / Self Care | Payer: BLUE CROSS/BLUE SHIELD | Source: Ambulatory Visit | Attending: Obstetrics and Gynecology | Admitting: Obstetrics and Gynecology

## 2014-01-14 DIAGNOSIS — N63 Unspecified lump in unspecified breast: Secondary | ICD-10-CM

## 2014-04-07 ENCOUNTER — Other Ambulatory Visit (HOSPITAL_COMMUNITY): Payer: Self-pay | Admitting: Psychiatry

## 2014-04-16 ENCOUNTER — Ambulatory Visit (HOSPITAL_COMMUNITY): Payer: Self-pay | Admitting: Psychiatry

## 2018-12-02 ENCOUNTER — Other Ambulatory Visit: Payer: Self-pay | Admitting: Obstetrics and Gynecology

## 2018-12-02 DIAGNOSIS — R928 Other abnormal and inconclusive findings on diagnostic imaging of breast: Secondary | ICD-10-CM

## 2018-12-09 ENCOUNTER — Ambulatory Visit: Payer: BLUE CROSS/BLUE SHIELD

## 2018-12-09 ENCOUNTER — Other Ambulatory Visit: Payer: Self-pay

## 2018-12-09 ENCOUNTER — Ambulatory Visit
Admission: RE | Admit: 2018-12-09 | Discharge: 2018-12-09 | Disposition: A | Discharge status: Home / Self Care | Payer: BC Managed Care – PPO | Source: Ambulatory Visit | Attending: Obstetrics and Gynecology | Admitting: Obstetrics and Gynecology

## 2018-12-09 DIAGNOSIS — R928 Other abnormal and inconclusive findings on diagnostic imaging of breast: Secondary | ICD-10-CM

## 2020-10-04 IMAGING — MG MM DIGITAL DIAGNOSTIC UNILAT*L* W/ TOMO W/ CAD
4 series · 4 of 12 positions shown · non-contrast
Comparison: Previous exam(s).

CLINICAL DATA: Recall from screening mammography with
tomosynthesis, possible focal asymmetry involving the LOWER INNER
LEFT breast at ANTERIOR to MIDDLE depth.

EXAM:
DIGITAL DIAGNOSTIC UNILATERAL LEFT MAMMOGRAM WITH TOMO

[L MLO synth-2D]
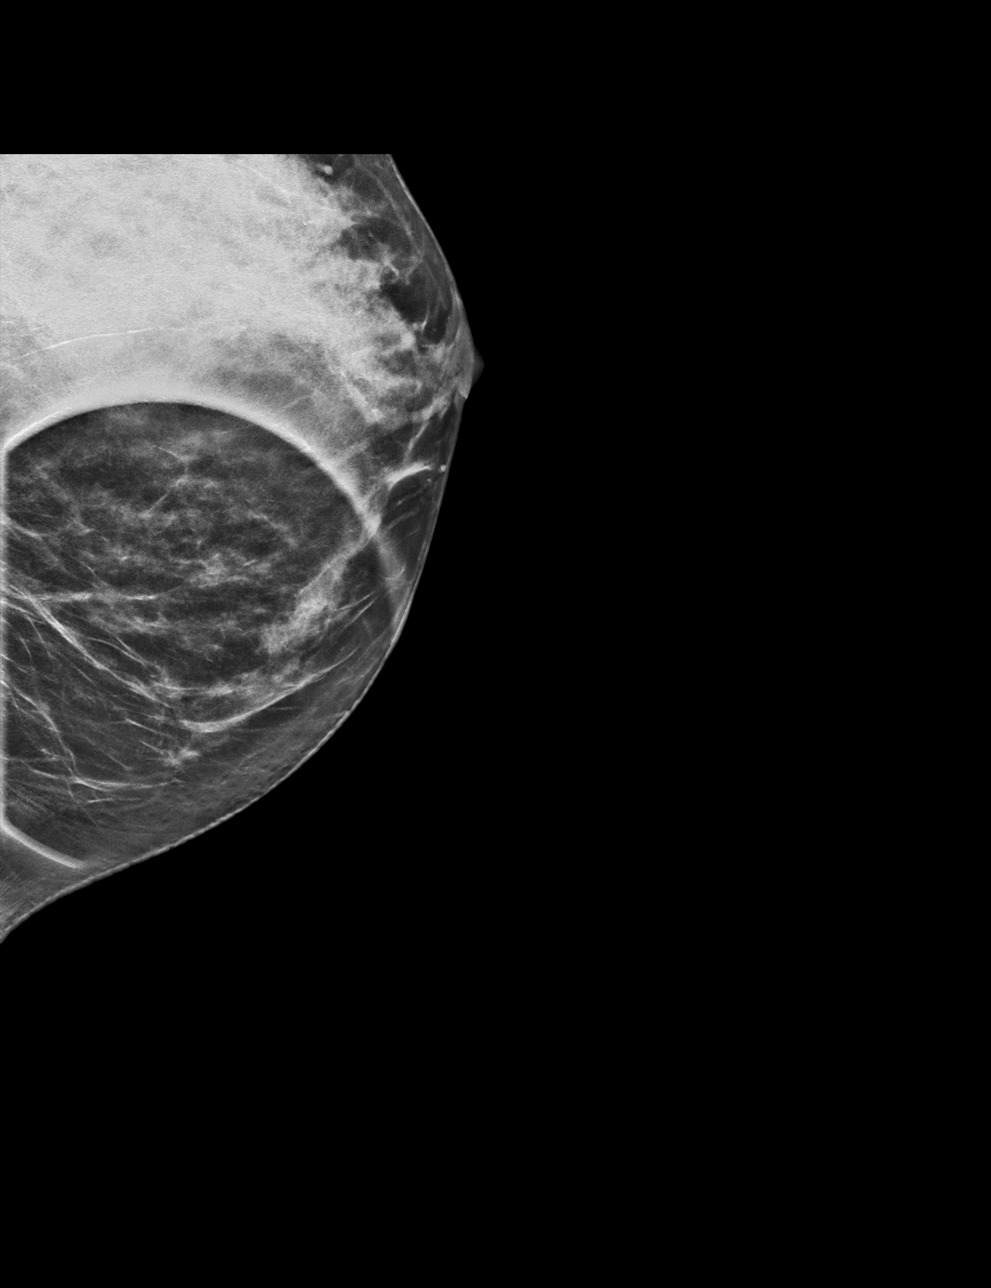

[L CC synth-2D]
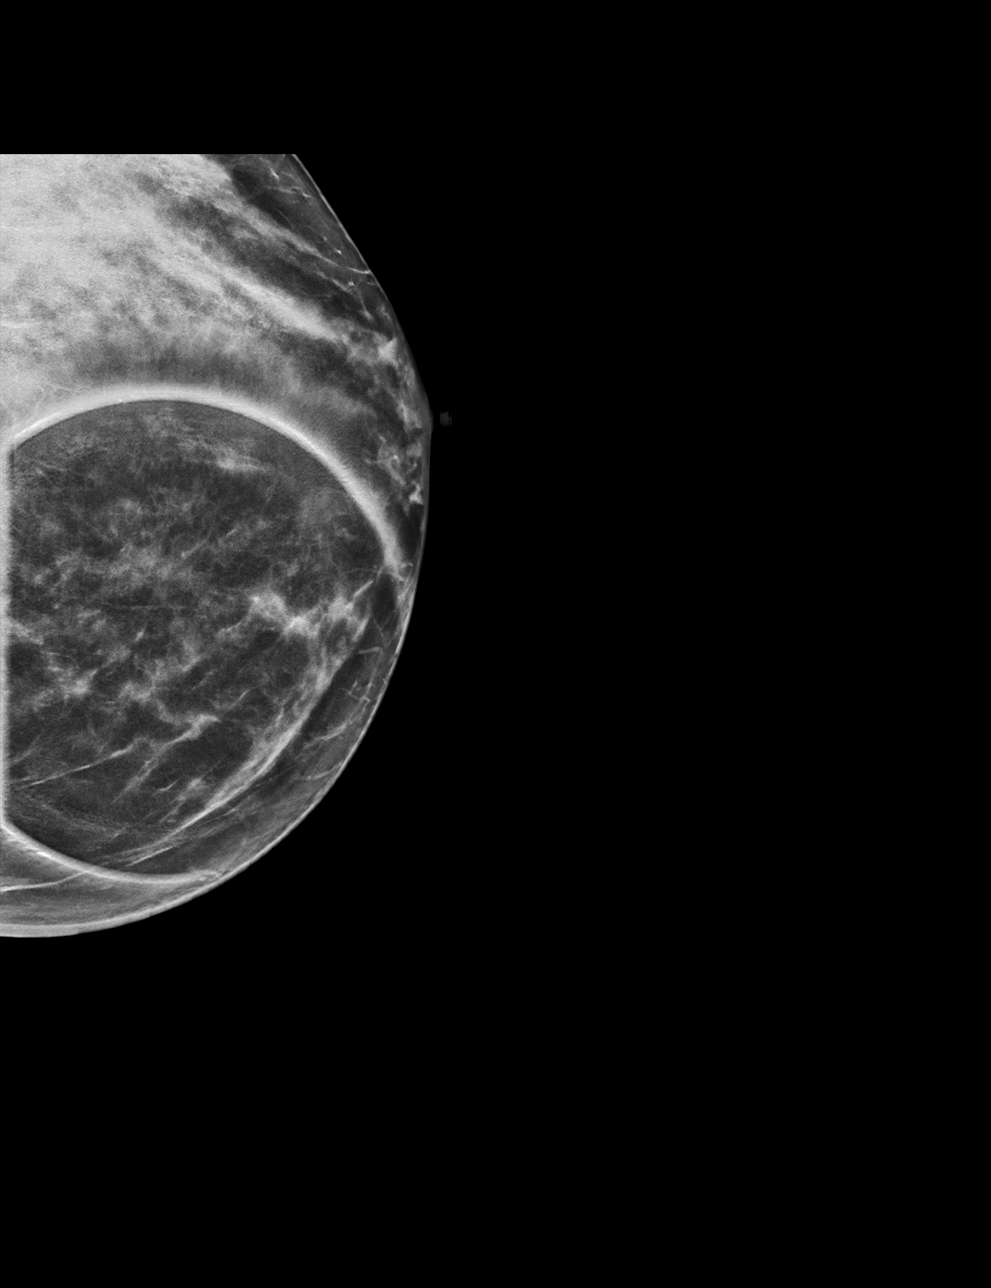

[L CC tomo · tomo slice 31/60.0]
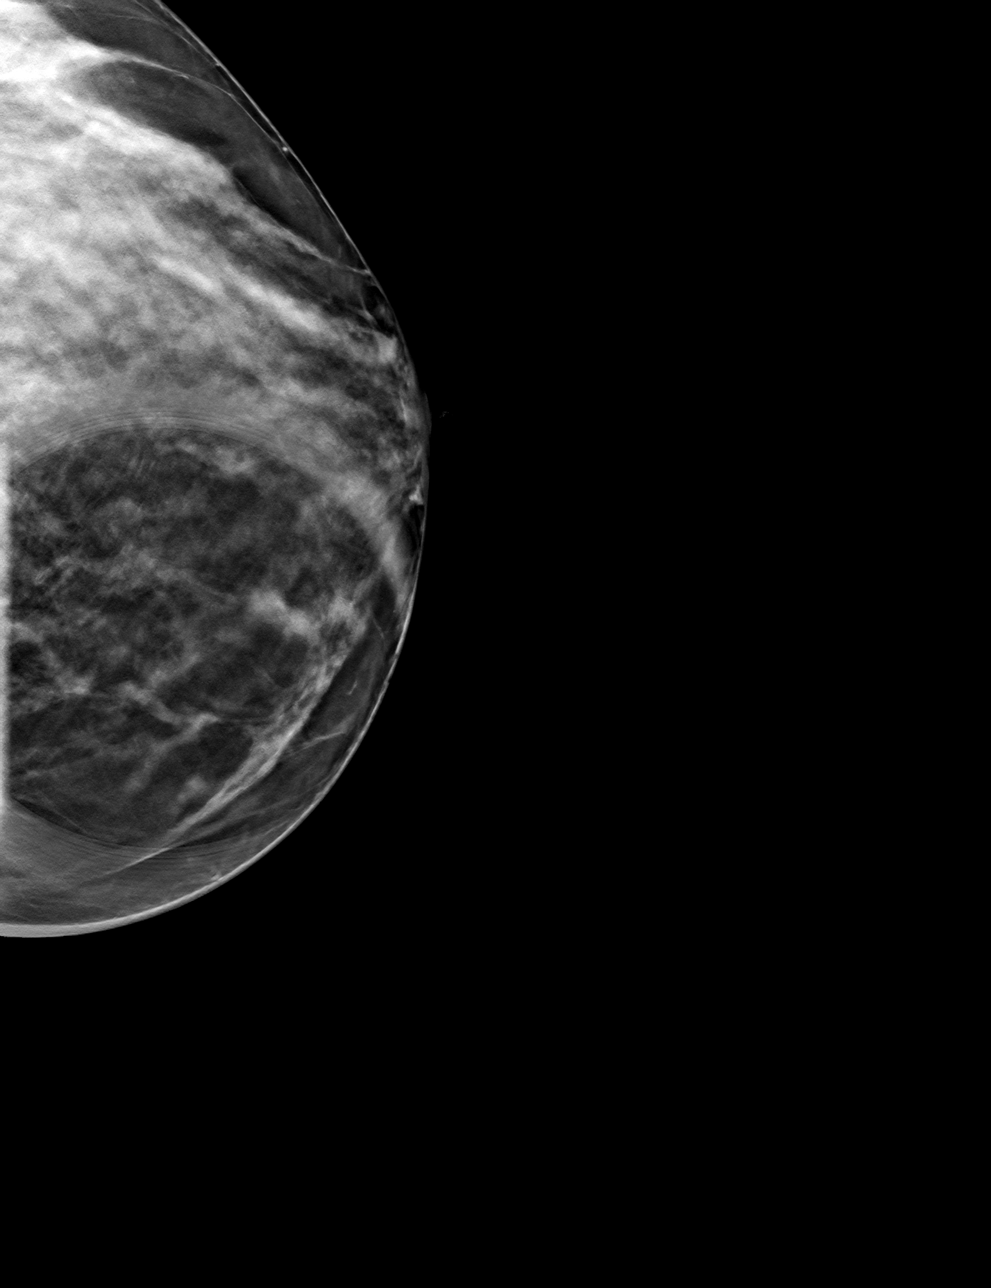

[L MLO tomo · tomo slice 28/55.0]
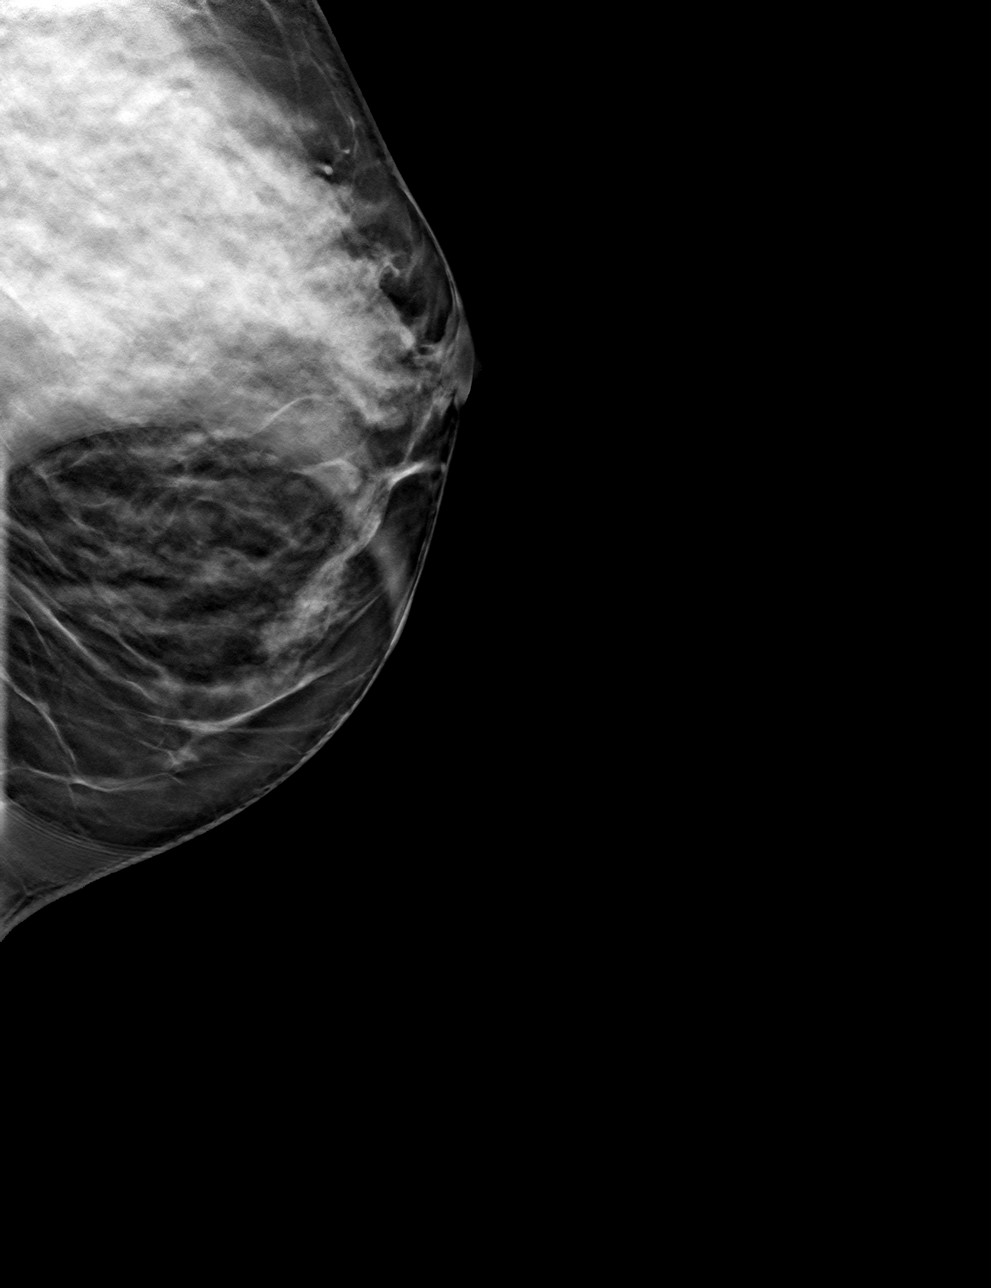

[4 of 12 positions shown; findings below may reference images not displayed]

ACR Breast Density Category c: The breast tissue is heterogeneously
dense, which may obscure small masses.
FINDINGS: Tomosynthesis and synthesized spot-compression CC and MLO views of
the area of concern in the LEFT breast were obtained.

The focal asymmetry in the LOWER INNER breast questioned on
screening mammography disperses with compression and there is no
underlying mass or architectural distortion.
IMPRESSION: No mammographic evidence of malignancy involving the LEFT breast.

RECOMMENDATION:
Screening mammogram in one year.(Code:JH-[id number]-AOO)

I have discussed the findings and recommendations with the patient.
If applicable, a reminder letter will be sent to the patient
regarding the next appointment.

BI-RADS CATEGORY  1: Negative.
